# Patient Record
Sex: Female | Born: 1950 | Race: White | Hispanic: No | Marital: Married | Smoking: Never smoker
Health system: Southern US, Community
[De-identification: ages and names within clinical notes are randomized; demographics above are authoritative.]

## PROBLEM LIST (undated history)

## (undated) DIAGNOSIS — M1990 Unspecified osteoarthritis, unspecified site: Secondary | ICD-10-CM

## (undated) DIAGNOSIS — I499 Cardiac arrhythmia, unspecified: Secondary | ICD-10-CM

## (undated) DIAGNOSIS — I10 Essential (primary) hypertension: Secondary | ICD-10-CM

## (undated) DIAGNOSIS — T7840XA Allergy, unspecified, initial encounter: Secondary | ICD-10-CM

## (undated) DIAGNOSIS — M5030 Other cervical disc degeneration, unspecified cervical region: Secondary | ICD-10-CM

## (undated) HISTORY — PX: ABDOMINAL HYSTERECTOMY: SHX81

## (undated) HISTORY — DX: Cardiac arrhythmia, unspecified: I49.9

## (undated) HISTORY — DX: Essential (primary) hypertension: I10

## (undated) HISTORY — PX: COLONOSCOPY: SHX174

## (undated) HISTORY — DX: Allergy, unspecified, initial encounter: T78.40XA

---

## 1996-12-15 HISTORY — PX: BLADDER SUSPENSION: SHX72

## 2000-02-11 ENCOUNTER — Encounter: Admission: RE | Admit: 2000-02-11 | Discharge: 2000-02-11 | Payer: Self-pay | Admitting: Internal Medicine

## 2000-02-11 ENCOUNTER — Encounter: Payer: Self-pay | Admitting: Internal Medicine

## 2017-11-09 ENCOUNTER — Other Ambulatory Visit: Payer: Self-pay | Admitting: Gastroenterology

## 2017-12-15 DIAGNOSIS — I4891 Unspecified atrial fibrillation: Secondary | ICD-10-CM | POA: Insufficient documentation

## 2017-12-28 ENCOUNTER — Encounter: Payer: Self-pay | Admitting: Gastroenterology

## 2018-01-15 ENCOUNTER — Encounter: Payer: Self-pay | Admitting: Gastroenterology

## 2018-01-30 ENCOUNTER — Encounter: Payer: Self-pay | Admitting: Gastroenterology

## 2018-02-16 ENCOUNTER — Encounter: Payer: Self-pay | Admitting: Cardiology

## 2018-02-16 ENCOUNTER — Ambulatory Visit: Payer: PRIVATE HEALTH INSURANCE | Attending: Cardiology | Admitting: Cardiology

## 2018-02-16 ENCOUNTER — Other Ambulatory Visit: Payer: Self-pay | Admitting: Cardiology

## 2018-02-16 VITALS — BP 157/70 | HR 63 | Ht 64.0 in | Wt 149.0 lb

## 2018-02-16 DIAGNOSIS — I4891 Unspecified atrial fibrillation: Secondary | ICD-10-CM

## 2018-02-16 NOTE — Progress Notes (Addendum)
I have had the pleasure of seeing your patient, Sheena Rodriguez in the Cardiology Clinic at the outpatient office at [facility].  Below is a summary of our recent visit:       Patient is  67 yo female with:    Past Medical History of:    1. A. Fibrillation  2. Vit D deficiency  3. Allergic rhinitis  4. ? Aortic insufficiency/?ASD  5. H/o vasovagal syncope  6. Heart murmur.    She presents for cardiology visit (for a second opinion). She had some intermittent palpitations in November. Had ECG which showed sinus bradycardia, first degree AV block (new), and nonspecific ST-T changes. Holter was done, was negative. Was noted to have A. Fibrillation pre-procedure prior to colonoscopy. She was seen by cardiologist few days later and was prescribed Xarelto and Metoprolol 25 mg bid. She felt not good with Metoprolol ("spacy", "buzzing sensation in her head", had near syncopal episode). She stopped Metoprolol Feb 1st, 2019. Has no palpitations recently. Was not aware she had A. Fib when diagnosed. Had event monitor in February 2019. She has occasional chest tightness, it is on the left side, 2-3/10 in intensity. Almost feels like a "pulled pectoral muscle". It is very brief. It was happening since she stopped metoprolol. She does exercise. However she feels tired. No SOB, orthopnea, PND. No leg edema. In the past her BP was 124/70 and 118/68 mmHg at previous visits.  Her husband has h/o prostate cancer, and now esophageal cancer 8 years ago.    ROS: Card: as per HPI. Other systems were reviewed and were negative.      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 No children.      FH: Mother was an alcoholic and smoker, died from lung cancer at 22 yo.   Father died at 36 yo from pancreatic cancer.  Brother (57 yo)- alcoholic.   Grandmother had CVA.    Current Outpatient Prescriptions   Medication Sig    rivaroxaban (XARELTO) 20 MG tablet Take 20 mg by mouth daily     cholecalciferol (VITAMIN D) 2000 units tablet Take 2,000 Units by mouth daily    multi-vitamin (MULTIVITAMIN) per tablet Take 1 tablet by mouth daily    Magnesium 200 MG TABS Take by mouth     No current facility-administered medications for this visit.        BP 157/70 (BP Location: Right arm, Patient Position: Sitting, Cuff Size: adult)    Pulse 63    Ht 1.626 m (5\' 4" )    Wt 67.6 kg (149 lb)    BMI 25.58 kg/m    Appears comfortable, A&O x3, NAD.  HEENT: Pupils equal round reactive. Sclerae anicteric. Neck is supple with no lymphadenopathy or thyromegaly. JVP is not elevated.   Cardiovascular: Normal S1-S2 no S3 no S4. Ejection click, 1/6 SEM. PMI is non-sustained and non-displaced.   Respiratory: Normal vesicular breath sounds. Clear to auscultation bilaterally without rhonchi, rales or wheezes.   GI: Bowel sounds are present. There is no hepatosplenomegaly, bruits or masses.   Extremities: 2+ dorsalis pedis pulse. 2+ posterior tibial pulse. No clubbing, cyanosis or edema.     ECG: NSR 60 bpm, normal.    Labs: Hg 13.3, HCT 41%    TSH 1.83, Glucose 87, BUN 14, creat 0.7, K 4.4, LFT's normal, Chol 175, TG 48, HDL 86, LDL 79.      A/P: Patient is a 67 yo female with  PAF.  Get results of previos testing.  Stress Echo to evaluate LVEF, r/o bicuspid AV (based on exam) and arrhythmia with exercise, r/o ischemia.  BP elevated. Needs probably to be treated. She will buy BP monitor and journal BP at home.  Low salt diet.  Magnesium supplement (Slow Mag).  Lipid profile acceptable.  Risk factor modification was discussed.       Thank-you very much for allowing me to participate in the care of Sheena Rodriguez. Please feel free to contact our office if you have any further questions/concerns.    Sincerely,    Unknown FoleyHanna Porcia Morganti MD

## 2018-02-23 LAB — EKG 12-LEAD
P: 48 deg
PR: 204 ms
QRS: 20 deg
QRSD: 82 ms
QT: 424 ms
QTc: 424 ms
Rate: 60 {beats}/min
Severity: NORMAL
T: 17 deg

## 2018-03-02 ENCOUNTER — Ambulatory Visit
Admission: RE | Admit: 2018-03-02 | Discharge: 2018-03-02 | Disposition: A | Discharge status: Home / Self Care | Payer: PRIVATE HEALTH INSURANCE | Source: Ambulatory Visit | Attending: Cardiology | Admitting: Cardiology

## 2018-03-02 ENCOUNTER — Ambulatory Visit: Payer: PRIVATE HEALTH INSURANCE | Attending: Cardiology | Admitting: Cardiology

## 2018-03-02 ENCOUNTER — Encounter: Payer: Self-pay | Admitting: Cardiology

## 2018-03-02 VITALS — BP 149/67 | HR 76 | Ht 64.0 in | Wt 149.0 lb

## 2018-03-02 DIAGNOSIS — I4891 Unspecified atrial fibrillation: Secondary | ICD-10-CM

## 2018-03-02 LAB — EXERCISE STRESS ECHO COMPLETE
AR CWD Gradient (peak): 62.2 mmHg
AR Velocity (peak): 394.4 cm/s
AV Area (LVOT SR Mtd): 2.99 cm2
AV CWD Velocity (Peak): 105.4 cm/s
AV Gradient (peak): 0.4 mmHg
Aortic Diameter (mid tubular): 2.9 cm
Aortic Diameter (sinus of Valsalva): 3.3 cm
BMI: 25.6 kg/m2
BP Diastolic: 68 mmHg
BP Systolic: 118 mmHg
BSA: 1.75 m2
Deceleration Time - AR: 2718.6 ms
Deceleration Time - MV: 195 ms
E/A ratio: 0.94
ECG PR interval: 220 ms
ECG QRS interval: 80 ms
Echo RV Stroke Work Index Estimate: 823.2 mmHg•mL/m2
Estimated workload: 11 METS
Heart Rate: 70 {beats}/min
Height: 64 in
IVC Diameter: 1.7 cm
LA Diameter BSA Index: 2.3 cm/m2
LA Diameter Height Index: 2.5 cm/m
LA Diameter: 4.1 cm
LA Systolic Vol BSA Index: 37.7 mL/m2
LA Systolic Vol Height Index: 40.6 mL/m
LA Systolic Volume: 66 mL
LV ASE Mass BSA Index: 67.8 gm/m2
LV ASE Mass Height 2.7 Index: 31.9 gm/m2.7
LV ASE Mass Height Index: 72.9 gm/m
LV ASE Mass: 118.6 gm
LV CO BSA Index: 2.35 L/min/m2
LV Cardiac Output: 4.12 L/min
LV Diastolic Volume Index: 51.5 mL/m2
LV Posterior Wall Thickness: 0.8 cm
LV SV - LVOT SV Diff: -4 mL
LV SV BSA Index: 33.6 mL/m2
LV SV Height Index: 36.2 mL/m
LV Septal Thickness: 0.9 cm
LV Stroke Volume: 58.8 mL
LV Systolic Volume Index: 17.9 mL/m2
LVED Diameter BSA Index: 2.5 cm/m2
LVED Diameter Height Index: 2.7 cm/m
LVED Diameter: 4.4 cm
LVED Volume BSA Index: 51 ml/m2
LVED Volume BSA Index: 51.5 mL/m2
LVED Volume Height Index: 55.4 mL/m
LVED Volume: 90.1 mL
LVEF (Volume): 65 %
LVES Diameter BSA Index: 1.6 cm/m2
LVES Diameter Height Index: 1.7 cm/m
LVES Diameter: 2.8 cm
LVES Volume BSA Index: 17.9 mL/m2
LVES Volume BSA Index: 18 ml/m2
LVES Volume Height Index: 19.3 mL/m
LVES Volume: 31.3 mL
LVOT + AV Gradient (peak): 4.4 mmHg
LVOT Area (calculated): 3.14 cm2
LVOT Cardiac Index: 2.51 L/min/m2
LVOT Cardiac Output: 4.4 L/min
LVOT Diameter: 2 cm
LVOT PWD VTI: 20 cm
LVOT PWD Velocity (mean): 60.1 cm/s
LVOT PWD Velocity (peak): 100.3 cm/s
LVOT SV BSA Index: 35.89 mL/m2
LVOT SV Height Index: 38.6 mL/m
LVOT Stroke Rate (mean): 188.7 mL/s
LVOT Stroke Rate (peak): 314.9 mL/s
LVOT Stroke Volume: 62.8 cc
LVOT/AV Velocity Ratio: 0.95
MPHR: 155 {beats}/min
MR Regurgitant Fraction (LV SV Mtd): -0.07
MR Regurgitant Volume (LV SV Mtd): -4 mL
MV Peak A Velocity: 69.2 cm/s
MV Peak E Velocity: 65 cm/s
Mitral Annular E/Ea Vel Ratio: 11.82
Mitral Annular Ea Velocity: 5.5 cm/s
PA Mean Pressure Estimate: 19.2 mmHg
PA Systolic Pressure Estimate: 27.7 mmHg
PR CWD Gradient (peak): 11.2 mmHg
PV CWD Velocity (peak): 110 cm/s
Peak DBP: 90 mmHg
Peak Gradient - TR: 24.5 mmHg
Peak HR: 147 {beats}/min
Peak SBP: 178 mmHg
Peak Velocity - TR: 247.4 cm/s
Peak Velocity PR: 167.1 cm/s
Percent MPHR: 94.8 %
Pressure Half-Time - AR: 788.4 ms
Pulmonary Vascular Resistance Estimate: 6 mmHg
RA Pressure Estimate: 8 mmHg
RA Volume BSA Index: 15.4 mL/m2
RA Volume Height Index: 16.6 mL/m
RA Volume: 27 mL
RPP: 26166 BPM x mmHG
RR Interval: 857.14 ms
RV Peak Systolic Pressure: 32.5 mmHg
RVOT + PV Gradient (peak): 4.8 mmHg
Stress Peak Stage: 3.67
Stress duration (min): 11 min
Stress duration (sec): 1 s
Weight (lbs): 149 [lb_av]
Weight: 2384 oz

## 2018-03-02 NOTE — Progress Notes (Signed)
I have had the pleasure of seeing your patient, Sheena Rodriguez in the Cardiology Clinic at the outpatient office at [facility].  Below is a summary of our recent visit:       Patient is  67 yo female with:    Past Medical History of:    1. A. Fibrillation  2. Vit D deficiency  3. Allergic rhinitis  4. ? Aortic insufficiency/?ASD  5. H/o vasovagal syncope  6. Heart murmur/sclerotic AV/mild AI.      She presents for cardiology visit (for a second opinion). She had some intermittent palpitations in November. Had ECG which showed sinus bradycardia, first degree AV block (new), and nonspecific ST-T changes. Holter was done, was negative. Was noted to have A. Fibrillation pre-procedure prior to colonoscopy. She was seen by cardiologist few days later and was prescribed Xarelto and Metoprolol 25 mg bid. She felt not good with Metoprolol ("spacy", "buzzing sensation in her head", had near syncopal episode). She stopped Metoprolol Feb 1st, 2019. Has no palpitations recently. Was not aware she had A. Fib when diagnosed. Had event monitor in February 2019 (no results yet). She has occasional chest tightness. Almost feels like a "pulled pectoral muscle". It is very brief. She does exercise- no symptoms with exercise. Stress test was normal. However she feels tired. No SOB, orthopnea, PND.  No leg edema.  She is planning to move to [place].    ROS: Card: as per HPI. Other systems were reviewed and were negative.      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 Her husband has h/o prostate cancer, and now esophageal cancer 8 years ago.  No children.      FH: Mother was an alcoholic and smoker, died from lung cancer at 29 yo.   Father died at 43 yo from pancreatic cancer.  Brother (60 yo)- alcoholic.   Grandmother had CVA.    Current Outpatient Prescriptions   Medication Sig    other supply By no specified route   tumeric    rivaroxaban (XARELTO) 20 MG tablet Take 20 mg  by mouth daily    cholecalciferol (VITAMIN D) 2000 units tablet Take 2,000 Units by mouth daily    multi-vitamin (MULTIVITAMIN) per tablet Take 1 tablet by mouth daily    Magnesium 200 MG TABS Take by mouth     No current facility-administered medications for this visit.        BP 149/67 (BP Location: Left arm, Patient Position: Sitting, Cuff Size: adult)    Pulse 76    Ht 1.626 m (5\' 4" )    Wt 67.6 kg (149 lb)    BMI 25.58 kg/m    Appears comfortable, A&O x3, NAD.  HEENT: Pupils equal round reactive. Sclerae anicteric. Neck is supple with no lymphadenopathy or thyromegaly. JVP is not elevated.   Cardiovascular: Normal S1-S2 no S3 no S4. Ejection click, 1/6 SEM. PMI is non-sustained and non-displaced.   Respiratory: Normal vesicular breath sounds. Clear to auscultation bilaterally without rhonchi, rales or wheezes.   GI: Bowel sounds are present. There is no hepatosplenomegaly, bruits or masses.   Extremities: 2+ dorsalis pedis pulse. 2+ posterior tibial pulse. No clubbing, cyanosis or edema.     Stress Echo: Normal LVEF without significant regional wall motion abnormalities. Mild left atrial enlargement. Mild aortic valve sclerosis with mild insufficiency.  Mild mitral regurgitation.  Estimated normal pulmonary artery systolic pressure.  Normal hemodynamic response to exercise.  No evidence  of exercise induced ischemia at a moderate cardiac workload.    Labs: Hg 13.3, HCT 41%    TSH 1.83, Glucose 87, BUN 14, creat 0.7, K 4.4, LFT's normal, Chol 175, TG 48, HDL 86, LDL 79.      A/P: Patient is a 67 yo female with PAF. No recurrent A. Fib.  Will obtain Holter in May 2019 to see if there is any evidence of A. Fib.  Stress Echo with normal LVEF, mildly sclerotic AV (however unable to r/o bicuspid AV (based on exam), but only minimal AI.  No evidence of ischemia.  BP elevated again today (however, at home normal 125/86 mmHg, on few occasions 138/87 mmHg). Needs probably to be treated. She will continue journal BP at  home for now. Bring the BP monitor to the next visit. She tried to improve her diet. Low salt diet.  Continue Magnesium supplement (Slow Mag).  Lipid profile acceptable.  Risk factor modification was discussed.       Thank-you very much for allowing me to participate in the care of Sheena Rodriguez. Please feel free to contact our office if you have any further questions/concerns.    Sincerely,    Unknown FoleyHanna Cortlandt Capuano MD

## 2018-03-02 NOTE — Addendum Note (Signed)
Addended by: Mariel CraftMIESZCZANSKA, Kohner Orlick Z on: 03/02/2018 10:47 AM     Modules accepted: Orders

## 2018-03-02 NOTE — Discharge Instructions (Signed)
Patient is here for a stress echo. Instructed patient to resume medications and to follow up with referring provider. Patient verbalized understanding.

## 2018-04-05 ENCOUNTER — Encounter: Payer: Self-pay | Admitting: Gastroenterology

## 2018-04-06 ENCOUNTER — Encounter: Payer: Self-pay | Admitting: Gastroenterology

## 2018-04-23 ENCOUNTER — Ambulatory Visit: Payer: PRIVATE HEALTH INSURANCE | Attending: Cardiology | Admitting: Cardiology

## 2018-04-23 ENCOUNTER — Encounter: Payer: Self-pay | Admitting: Cardiology

## 2018-04-23 VITALS — BP 156/76 | HR 55 | Ht 64.0 in | Wt 150.0 lb

## 2018-04-23 DIAGNOSIS — I4891 Unspecified atrial fibrillation: Secondary | ICD-10-CM

## 2018-04-23 NOTE — Progress Notes (Addendum)
I have had the pleasure of seeing your patient, Sheena Rodriguez in the Cardiology Clinic at the outpatient office at [facility]. Below is a summary of our recent visit:       Patient is  67 yo female with:    Past Medical History of:    1. A. Fibrillation  2. Vit D deficiency  3. Allergic rhinitis  4. ? Aortic insufficiency/?ASD  5. H/o vasovagal syncope  6. Heart murmur/sclerotic AV/mild AI.      She presents for cardiology visit (for a second opinion). She had some intermittent palpitations in November. Had ECG which showed sinus bradycardia, first degree AV block (new), and nonspecific ST-T changes. Holter was done, was negative. Was noted to have A. Fibrillation pre-procedure prior to colonoscopy. She was seen by cardiologist few days later and was prescribed Xarelto and Metoprolol 25 mg bid. She felt not good with Metoprolol ("spacy", "buzzing sensation in her head", had near syncopal episode). She stopped Metoprolol Feb 1st, 2019. Has no palpitations recently. Was not aware she had A. Fib when diagnosed. Had event monitor in February 2019 (no results yet). Chest discomfort resolved. She does exercise- no symptoms with exercise. Stress test was normal. However she feels tired. No SOB, orthopnea, PND.  No leg edema. No palpitations, or dizziness. She reports feeling very well recently. She reduced caffeine intake. BP at home 122/87-137/83 mmHg.  She is planning to move to [place].     ROS: Card: as per HPI. Other systems were reviewed and were negative.      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 Her husband has h/o prostate cancer, and now esophageal cancer 8 years ago.  No children.      FH: Mother was an alcoholic and smoker, died from lung cancer at 47 yo.   Father died at 42 yo from pancreatic cancer.  Brother (30 yo)- alcoholic.   Grandmother had CVA.    Current Outpatient Prescriptions   Medication Sig    other supply By no specified route    tumeric    rivaroxaban (XARELTO) 20 MG tablet Take 20 mg by mouth daily    cholecalciferol (VITAMIN D) 2000 units tablet Take 2,000 Units by mouth daily    multi-vitamin (MULTIVITAMIN) per tablet Take 1 tablet by mouth daily    Magnesium 200 MG TABS Take by mouth     No current facility-administered medications for this visit.        BP 156/76 (BP Location: Right arm, Patient Position: Sitting, Cuff Size: adult)    Pulse 55    Ht 1.626 m ( )    Wt 68 kg (150 lb)    BMI 25.75 kg/m    Repeat BP 135/88 mmHg.  Appears comfortable, A&O x3, NAD.  HEENT: Pupils equal round reactive. Sclerae anicteric. Neck is supple with no lymphadenopathy or thyromegaly. JVP is not elevated.   Cardiovascular: Normal S1-S2 no S3 no S4. Ejection click, 1/6 SEM. PMI is non-sustained and non-displaced.   Respiratory: Normal vesicular breath sounds. Clear to auscultation bilaterally without rhonchi, rales or wheezes.   GI: Bowel sounds are present. There is no hepatosplenomegaly, bruits or masses.   Extremities: 2+ dorsalis pedis pulse. 2+ posterior tibial pulse. No clubbing, cyanosis or edema.     Stress Echo: Normal LVEF without significant regional wall motion abnormalities. Mild left atrial enlargement. Mild aortic valve sclerosis with mild insufficiency.  Mild mitral regurgitation.  Estimated normal pulmonary artery  systolic pressure.  Normal hemodynamic response to exercise.  No evidence of exercise induced ischemia at a moderate cardiac workload.    Labs (April 2019): HCT 42.5%    TSH 1.95, Glucose 94, BUN 14, creat 0.8, K 4.4, LFT's normal, Mg 2.2.    Chol 175, TG 48, HDL 86, LDL 79.      A/P: Patient is a 67 yo female with PAF. No recurrent A. Fib. She feels great.  Will obtain Holter in May 2019 to see if there is any evidence of A. Fib.  Stress Echo with normal LVEF, mildly sclerotic AV (however unable to r/o bicuspid AV (based on exam), but only minimal AI.  No evidence of ischemia.  BP elevated again today (however, at  home normal 125/86 mmHg, on few occasions 138/87 mmHg). Needs probably to be treated. She wants to hold off.  Low sodium diet. She tried to improve her diet. Low salt diet. Continue to monitor at home. In case she needs colonoscopy, can hold Xarelto as needed.  Continue Magnesium supplement. Reduced caffeine intake.  Lipid profile acceptable.  Risk factor modification was discussed.   Will send records to [facility].    Thank-you very much for allowing me to participate in the care of Sheena Rodriguez. Please feel free to contact our office if you have any further questions/concerns.    Sincerely,    Unknown Foley MD

## 2018-04-23 NOTE — Patient Instructions (Signed)
Plant based/Whole foods program:    Diet Tracking Tools:  Myfitnesspal.com (comes as a smartphone app and can be found online from any computer)    Activity Tracking Tools:  Fitbit  Any pedometer (counts number of steps taken per day)  Runtastic App for smartphones    MOVIES/BOOKS:  Forks Over Knives (Available on Netflix. It was inspired by The China Study)  Reciepe book: Flavor    GUIDEBOOKS:  The Campbell Plan, by Thomas M. Campbell, MD   Eat to Live, by Joel Furhman, MD  Dr. Neal Barnard's Program for Reversing Diabetes, by Neal Barnard, MD  Prevent and Reverse Heart Disease, by Caldwell Esselstyn, Jr., MD  The Starch Solution, by John McDougall, MD  Engine 2 Diet, by Rip Essesltyn    BOOKS ABOUT THE SCIENCE AND IMPACT OF DIET  The [place] Study, by T. Colin Campbell, PhD and Thomas M. Campbell, MD   The Food Revolution, by John Robbins    COOKBOOKS/RECIPES  Everyday Happy Herbivore, by Lindsay Nixon  The China Study Cookbook, by LeAnne Campbell   Www.fatfreevegan.com (stick to getting recipes from the blogs, particularly Susan Voisin, not reader submissions)  Www.straightupfood.com  Www.happyherbivore.com    ONLINE LECTURE (FREE):  Www.dresselstyn.com  Watch 2hr video in 'video' tab, about 7-8 videos down ([facility], 2 hour presentation)

## 2018-11-15 ENCOUNTER — Other Ambulatory Visit: Payer: Self-pay

## 2018-11-15 DIAGNOSIS — Z1211 Encounter for screening for malignant neoplasm of colon: Secondary | ICD-10-CM

## 2018-12-13 ENCOUNTER — Telehealth: Payer: Self-pay | Admitting: Gastroenterology

## 2018-12-13 NOTE — Telephone Encounter (Signed)
Patient called and has a colonoscopy scheduled for 12-20-17 with Dr Marius Ditch. She would like to know when to stop taking her Xarelto.

## 2018-12-14 NOTE — Telephone Encounter (Signed)
Returned patients call regarding when to stop her Xarelto.  She stated that in the past she stopped a week before and she has already stopped it just to make sure she can get her colonoscopy without having to change her date.  I've faxed blood thinner request to Dr. Betha Loa office again, also contacted office directly to inquire on status.  Kathy Holt received my fax and said she will ask Dr. Sebastian Blas to advise and send back.  Thanks [employer]

## 2018-12-17 ENCOUNTER — Encounter: Payer: Self-pay | Admitting: *Deleted

## 2018-12-20 ENCOUNTER — Ambulatory Visit
Admission: RE | Admit: 2018-12-20 | Payer: BC Managed Care – PPO | Source: Home / Self Care | Admitting: Gastroenterology

## 2018-12-20 ENCOUNTER — Encounter: Admission: RE | Payer: Self-pay | Source: Home / Self Care

## 2018-12-20 DIAGNOSIS — Z1211 Encounter for screening for malignant neoplasm of colon: Secondary | ICD-10-CM

## 2018-12-20 SURGERY — COLONOSCOPY WITH PROPOFOL
Anesthesia: General

## 2019-09-02 ENCOUNTER — Other Ambulatory Visit: Payer: Self-pay | Admitting: Family Medicine

## 2019-09-02 DIAGNOSIS — Z1231 Encounter for screening mammogram for malignant neoplasm of breast: Secondary | ICD-10-CM

## 2019-10-06 ENCOUNTER — Other Ambulatory Visit: Payer: Self-pay | Admitting: Internal Medicine

## 2019-10-06 DIAGNOSIS — Z1231 Encounter for screening mammogram for malignant neoplasm of breast: Secondary | ICD-10-CM

## 2019-11-15 ENCOUNTER — Ambulatory Visit
Admission: RE | Admit: 2019-11-15 | Discharge: 2019-11-15 | Disposition: A | Discharge status: Home / Self Care | Payer: BC Managed Care – PPO | Source: Ambulatory Visit | Attending: Internal Medicine | Admitting: Internal Medicine

## 2019-11-15 DIAGNOSIS — Z1231 Encounter for screening mammogram for malignant neoplasm of breast: Secondary | ICD-10-CM | POA: Insufficient documentation

## 2019-11-15 IMAGING — MG DIGITAL SCREENING BILAT W/ TOMO W/ CAD
8 series · 8 of 24 positions shown · non-contrast
Comparison: Previous exam(s).

CLINICAL DATA: Screening.

EXAM:
DIGITAL SCREENING BILATERAL MAMMOGRAM WITH TOMO AND CAD

[L CC synth-2D]
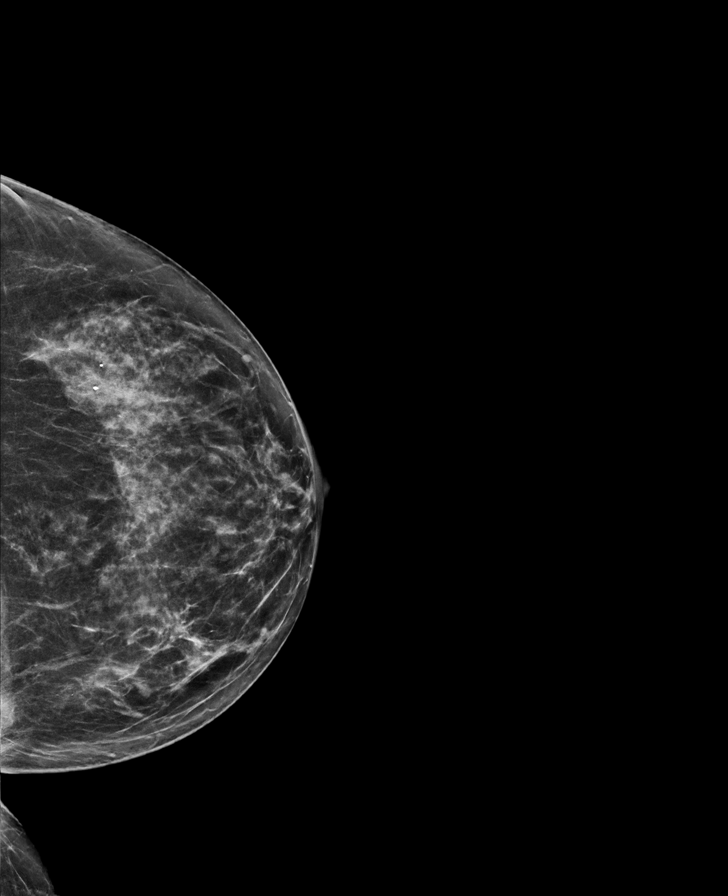

[R CC synth-2D]
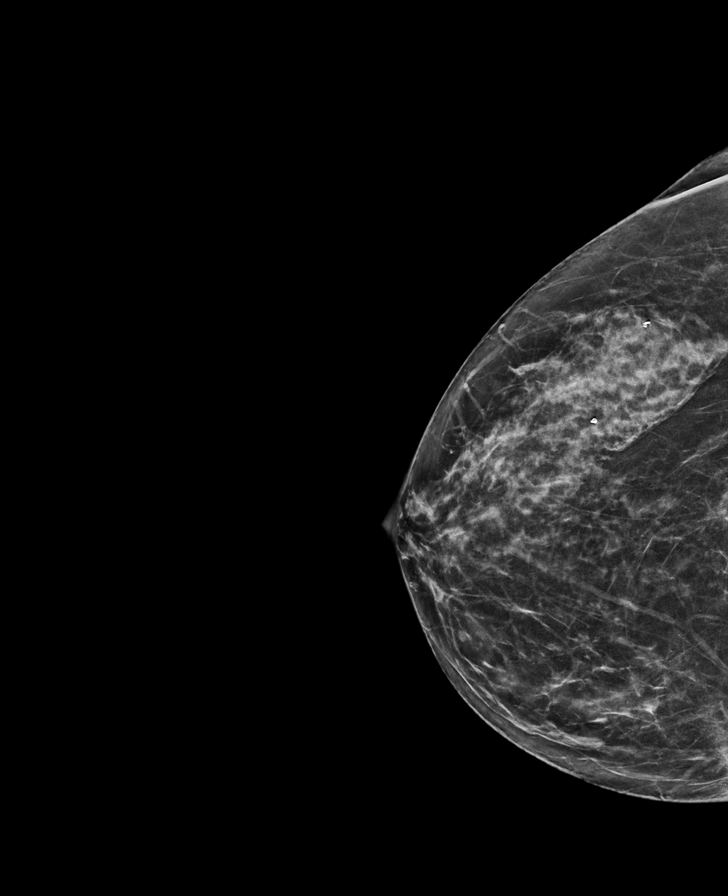

[R MLO synth-2D]
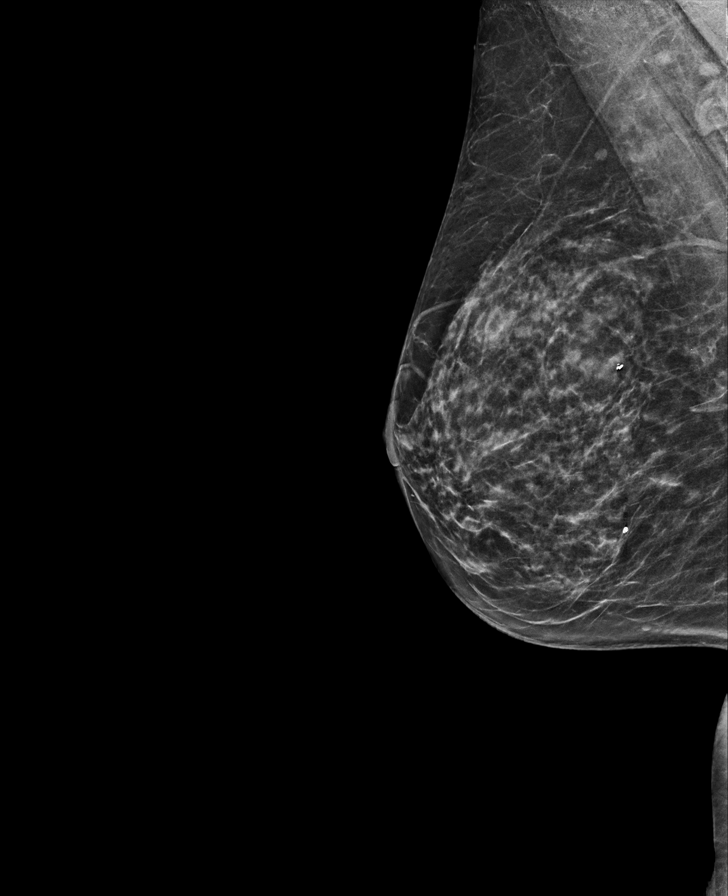

[L MLO synth-2D]
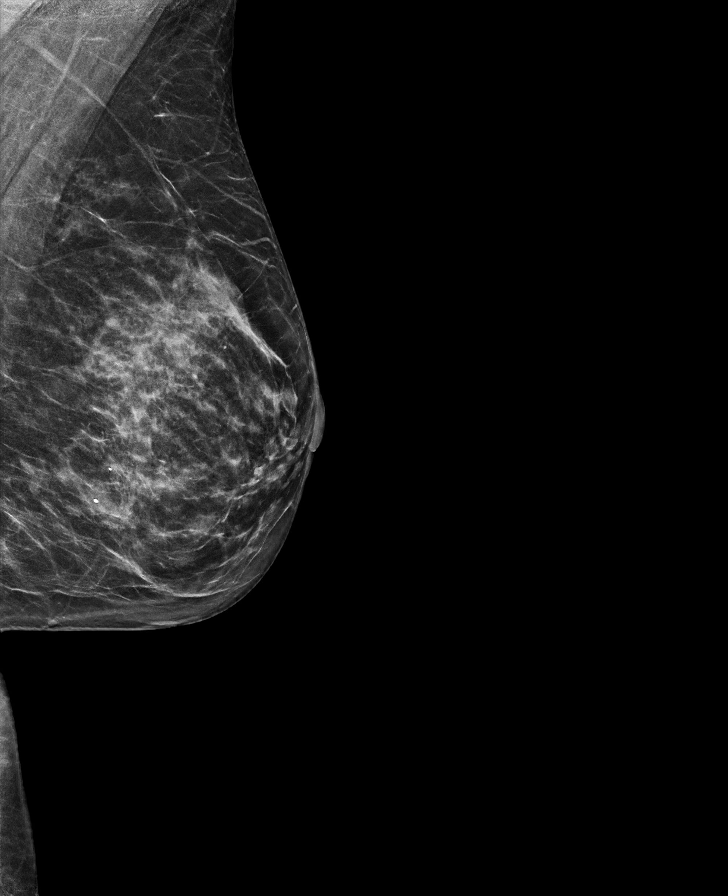

[L CC tomo · tomo slice 30/59.0]
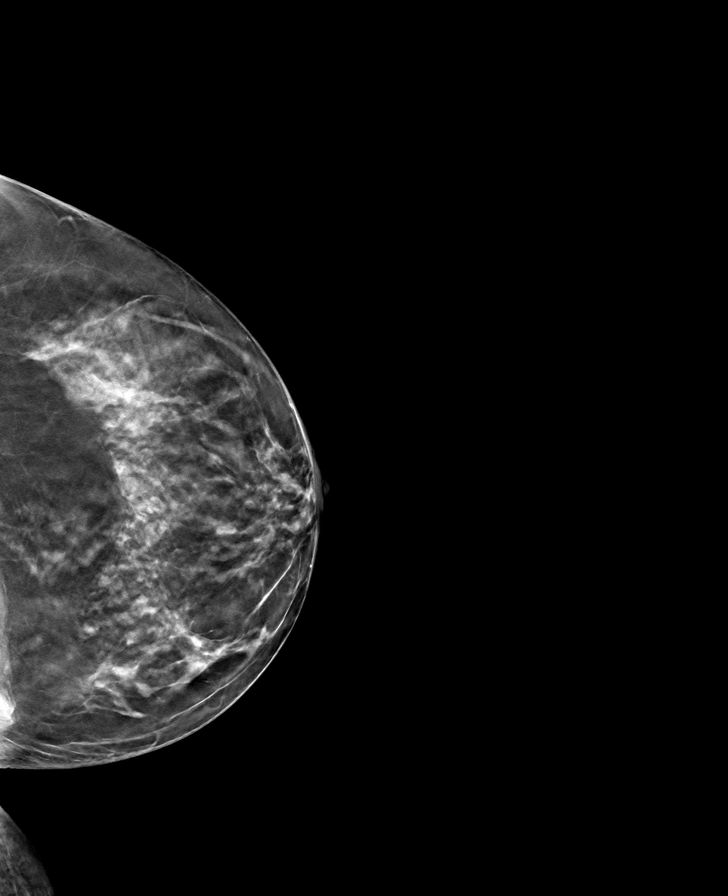

[R CC tomo · tomo slice 29/57.0]
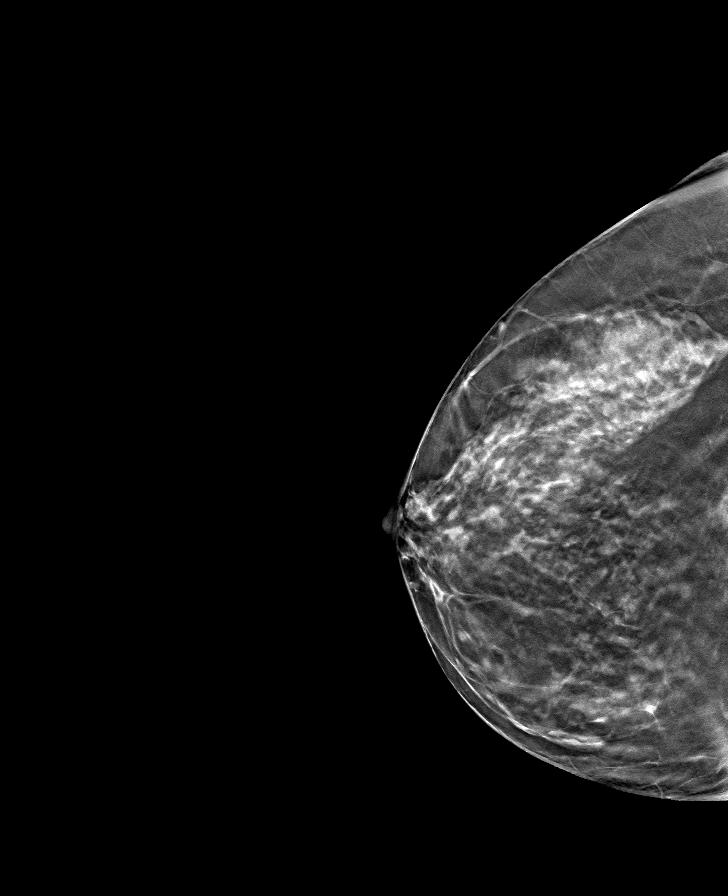

[L MLO tomo · tomo slice 30/59.0]
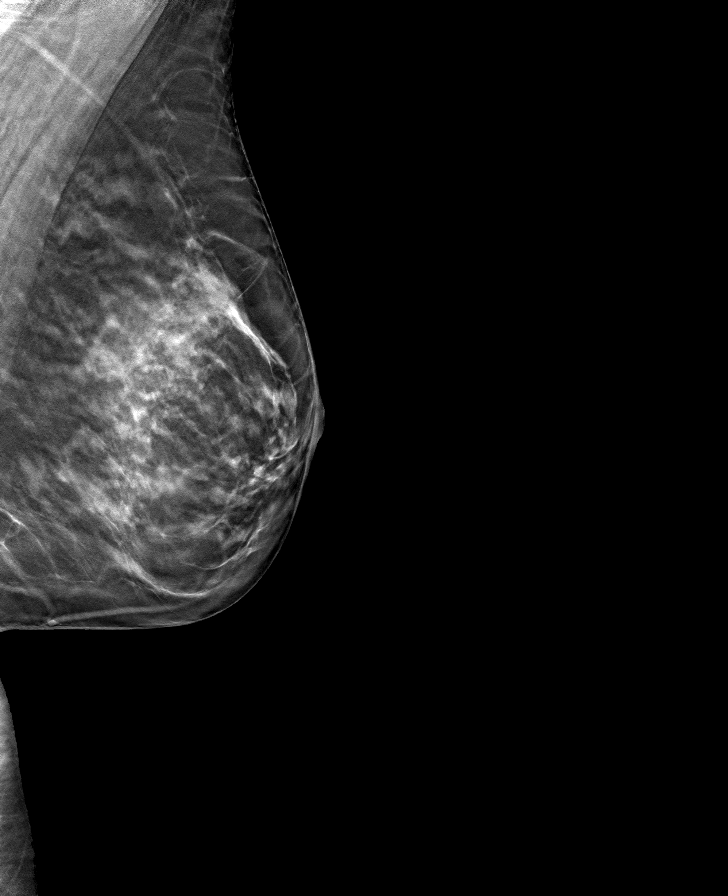

[R MLO tomo · tomo slice 29/58.0]
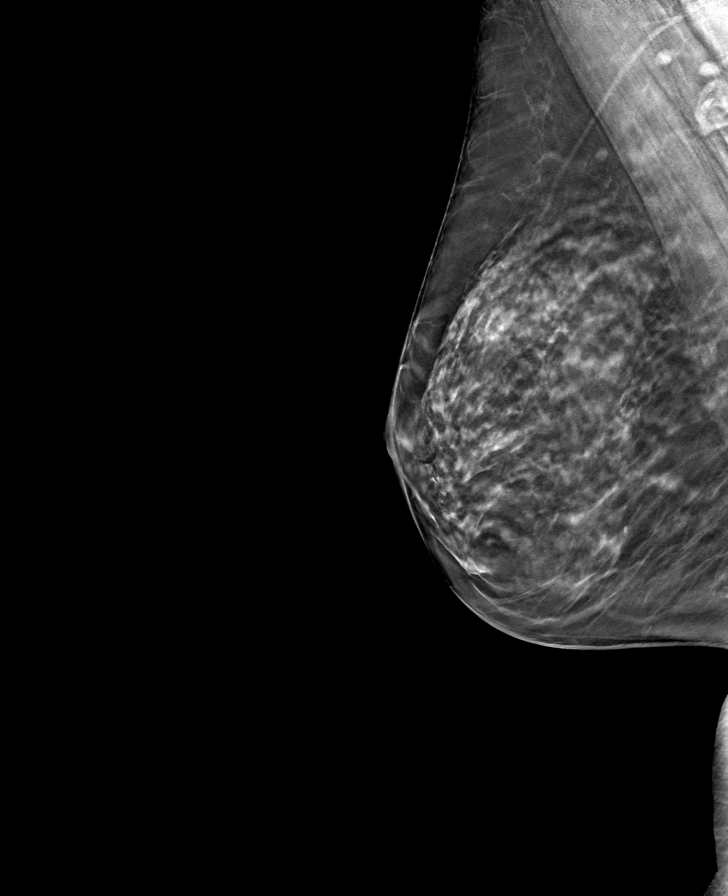

[8 of 24 positions shown; findings below may reference images not displayed]

ACR Breast Density Category c: The breast tissue is heterogeneously
dense, which may obscure small masses.
FINDINGS: There are no findings suspicious for malignancy. Images were
processed with CAD.
IMPRESSION: No mammographic evidence of malignancy. A result letter of this
screening mammogram will be mailed directly to the patient.

RECOMMENDATION:
Screening mammogram in one year. (Code:[5V])

BI-RADS CATEGORY  1: Negative.

## 2020-01-24 ENCOUNTER — Other Ambulatory Visit: Payer: Self-pay | Admitting: Sports Medicine

## 2020-01-24 DIAGNOSIS — M47816 Spondylosis without myelopathy or radiculopathy, lumbar region: Secondary | ICD-10-CM

## 2020-01-24 DIAGNOSIS — M5137 Other intervertebral disc degeneration, lumbosacral region: Secondary | ICD-10-CM

## 2020-01-24 DIAGNOSIS — G8929 Other chronic pain: Secondary | ICD-10-CM

## 2020-01-24 DIAGNOSIS — M545 Low back pain, unspecified: Secondary | ICD-10-CM

## 2020-01-27 ENCOUNTER — Ambulatory Visit: Payer: BC Managed Care – PPO | Attending: Internal Medicine

## 2020-01-27 DIAGNOSIS — Z23 Encounter for immunization: Secondary | ICD-10-CM

## 2020-01-27 NOTE — Progress Notes (Signed)
   Covid-19 Vaccination Clinic  Name:  Kathy Holt    MRN: [id number] DOB: 02-16-1951  01/27/2020  [name] was observed post Covid-19 immunization for 15 minutes without incidence. She was provided with Vaccine Information Sheet and instruction to access the V-Safe system.   [name] was instructed to call 911 with any severe reactions post vaccine: Marland Kitchen Difficulty breathing  . Swelling of your face and throat  . A fast heartbeat  . A bad rash all over your body  . Dizziness and weakness    Immunizations Administered    Name Date Dose VIS Date Route   Pfizer COVID-19 Vaccine 01/27/2020  9:04 AM 0.3 mL 11/25/2019 Intramuscular   Manufacturer: [place]   Lot: [id number]   [place]: [id number]

## 2020-02-08 ENCOUNTER — Ambulatory Visit: Payer: BC Managed Care – PPO

## 2020-02-21 ENCOUNTER — Ambulatory Visit: Payer: BC Managed Care – PPO | Attending: Internal Medicine

## 2020-02-21 DIAGNOSIS — Z23 Encounter for immunization: Secondary | ICD-10-CM

## 2020-02-21 NOTE — Progress Notes (Signed)
   Covid-19 Vaccination Clinic  Name:  AVANA JANIAK    MRN: [id number] DOB: [birth year]  02/21/2020  Ms. Sok was observed post Covid-19 immunization for 15 minutes without incident. She was provided with Vaccine Information Sheet and instruction to access the V-Safe system.   Ms. Molesky was instructed to call 911 with any severe reactions post vaccine: Marland Kitchen Difficulty breathing  . Swelling of face and throat  . A fast heartbeat  . A bad rash all over body  . Dizziness and weakness   Immunizations Administered    Name Date Dose VIS Date Route   Pfizer COVID-19 Vaccine 02/21/2020  9:06 AM 0.3 mL 11/25/2019 Intramuscular   Manufacturer: [place]   Lot: [id number]   [place]: [id number]

## 2020-03-15 ENCOUNTER — Other Ambulatory Visit: Payer: Self-pay | Admitting: Sports Medicine

## 2020-03-15 DIAGNOSIS — M545 Low back pain, unspecified: Secondary | ICD-10-CM

## 2020-03-15 DIAGNOSIS — M5137 Other intervertebral disc degeneration, lumbosacral region: Secondary | ICD-10-CM

## 2020-03-15 DIAGNOSIS — G8929 Other chronic pain: Secondary | ICD-10-CM

## 2020-03-15 DIAGNOSIS — M47816 Spondylosis without myelopathy or radiculopathy, lumbar region: Secondary | ICD-10-CM

## 2020-03-27 ENCOUNTER — Other Ambulatory Visit: Payer: Self-pay | Admitting: Sports Medicine

## 2020-03-27 DIAGNOSIS — M545 Low back pain, unspecified: Secondary | ICD-10-CM

## 2020-03-27 DIAGNOSIS — M5137 Other intervertebral disc degeneration, lumbosacral region: Secondary | ICD-10-CM

## 2020-03-27 DIAGNOSIS — G8929 Other chronic pain: Secondary | ICD-10-CM

## 2020-03-27 DIAGNOSIS — M51379 Other intervertebral disc degeneration, lumbosacral region without mention of lumbar back pain or lower extremity pain: Secondary | ICD-10-CM

## 2020-03-27 DIAGNOSIS — M47816 Spondylosis without myelopathy or radiculopathy, lumbar region: Secondary | ICD-10-CM

## 2020-04-05 DIAGNOSIS — G4700 Insomnia, unspecified: Secondary | ICD-10-CM | POA: Insufficient documentation

## 2020-04-05 DIAGNOSIS — M5489 Other dorsalgia: Secondary | ICD-10-CM | POA: Insufficient documentation

## 2020-10-23 ENCOUNTER — Other Ambulatory Visit: Payer: Self-pay | Admitting: Sports Medicine

## 2020-10-23 DIAGNOSIS — M25562 Pain in left knee: Secondary | ICD-10-CM

## 2020-10-23 DIAGNOSIS — M7632 Iliotibial band syndrome, left leg: Secondary | ICD-10-CM

## 2020-10-23 DIAGNOSIS — G8929 Other chronic pain: Secondary | ICD-10-CM

## 2020-10-26 ENCOUNTER — Other Ambulatory Visit: Payer: Self-pay | Admitting: Internal Medicine

## 2020-10-26 DIAGNOSIS — Z1231 Encounter for screening mammogram for malignant neoplasm of breast: Secondary | ICD-10-CM

## 2020-11-02 ENCOUNTER — Other Ambulatory Visit: Payer: Self-pay

## 2020-11-02 ENCOUNTER — Ambulatory Visit
Admission: RE | Admit: 2020-11-02 | Discharge: 2020-11-02 | Disposition: A | Discharge status: Home / Self Care | Payer: BC Managed Care – PPO | Source: Ambulatory Visit | Attending: Sports Medicine | Admitting: Sports Medicine

## 2020-11-02 DIAGNOSIS — G8929 Other chronic pain: Secondary | ICD-10-CM | POA: Diagnosis present

## 2020-11-02 DIAGNOSIS — M25562 Pain in left knee: Secondary | ICD-10-CM | POA: Insufficient documentation

## 2020-11-02 DIAGNOSIS — M7632 Iliotibial band syndrome, left leg: Secondary | ICD-10-CM | POA: Diagnosis present

## 2020-11-02 IMAGING — MR MR KNEE*L* W/O CM
6 series · 40 of 40 positions shown · non-contrast
Comparison: None.

CLINICAL DATA: Lateral left knee pain for 6 weeks

EXAM:
MRI OF THE LEFT KNEE WITHOUT CONTRAST
TECHNIQUE: Multiplanar, multisequence MR imaging of the knee was performed. No
intravenous contrast was administered.

[Series 4: T1 · coronal · 4.0mm · 0.62mm/px · 7 of 29 slices shown]
[im 1/29]
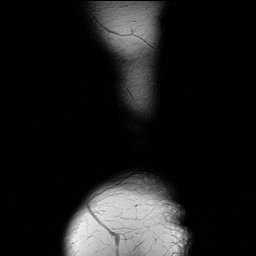
[im 5/29]
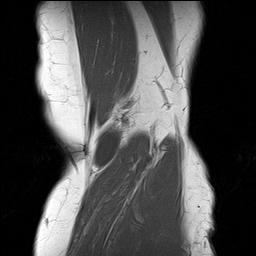
[im 10/29]
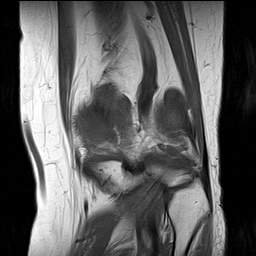
[im 15/29]
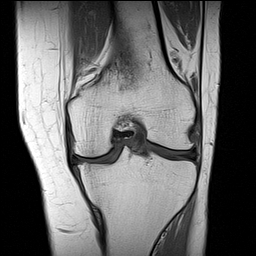
[im 19/29]
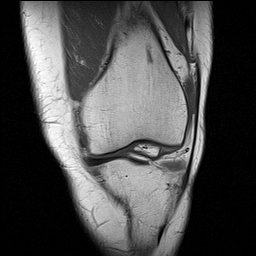
[im 24/29]
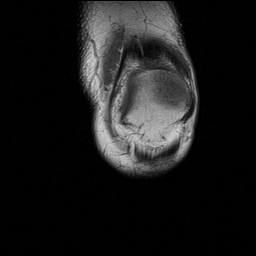
[im 29/29]
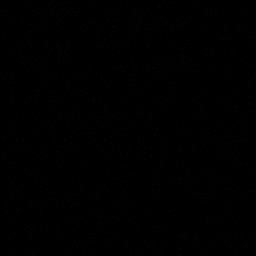

[Series 5: T2 fat-sat · coronal · 4.0mm · 0.62mm/px · 6 of 29 slices shown (1 of 3)]
[im 1/29]
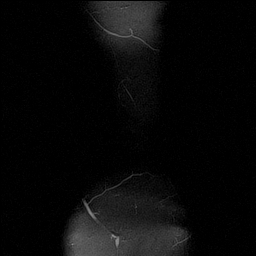
[im 6/29]
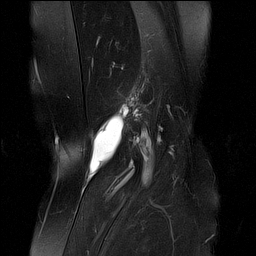
[im 12/29]
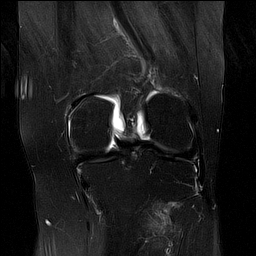
[im 17/29]
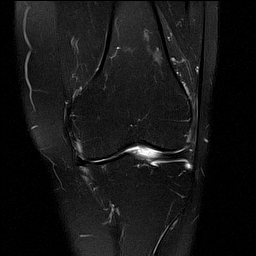
[im 23/29]
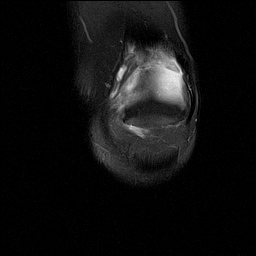
[im 29/29]
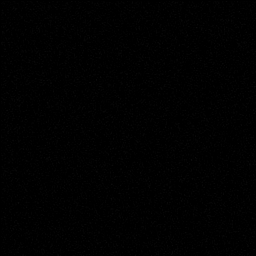

[Series 6: PD fat-sat · coronal · 4.0mm · 0.62mm/px · 6 of 29 slices shown (1 of 2)]
[im 1/29]
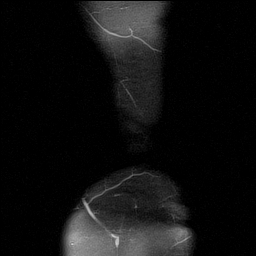
[im 6/29]
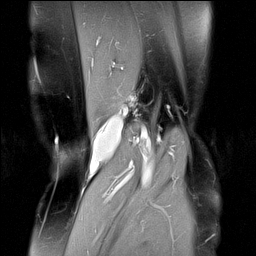
[im 12/29]
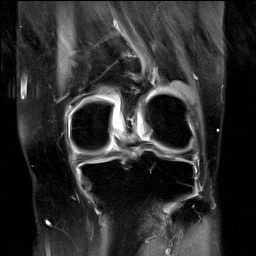
[im 17/29]
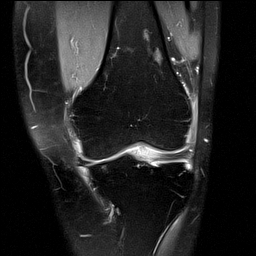
[im 23/29]
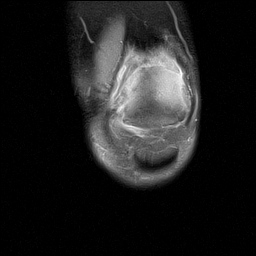
[im 29/29]
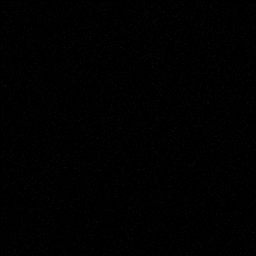

[Series 7: PD fat-sat · sagittal · 3.0mm · 0.62mm/px · 7 of 35 slices shown (2 of 2)]
[im 1/35]
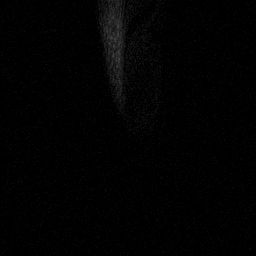
[im 6/35]
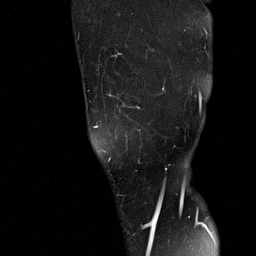
[im 12/35]
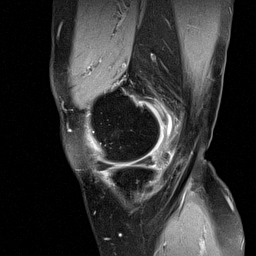
[im 18/35]
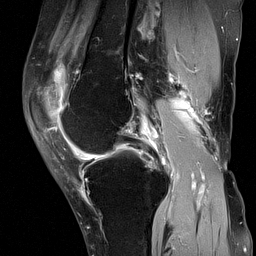
[im 23/35]
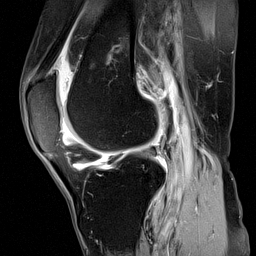
[im 29/35]
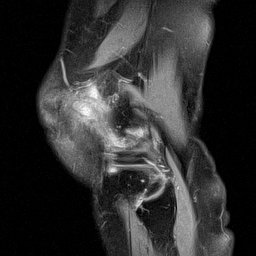
[im 35/35]
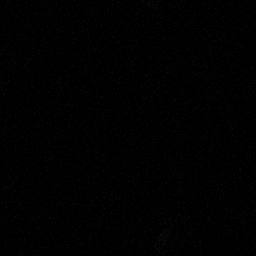

[Series 8: T2 fat-sat · sagittal · 3.0mm · 0.62mm/px · 7 of 35 slices shown (2 of 3)]
[im 1/35]
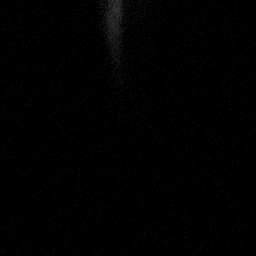
[im 6/35]
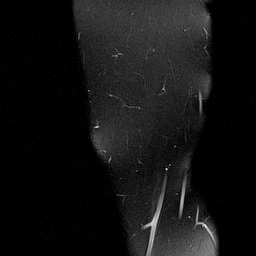
[im 12/35]
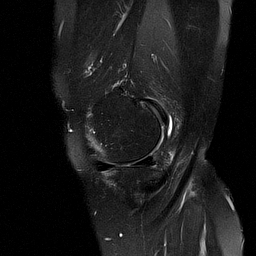
[im 18/35]
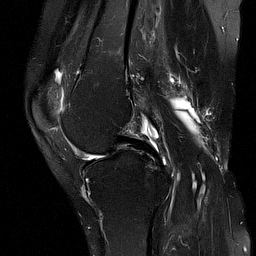
[im 23/35]
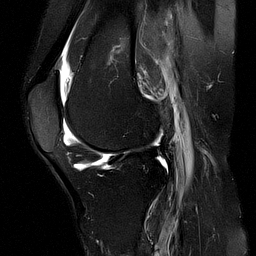
[im 29/35]
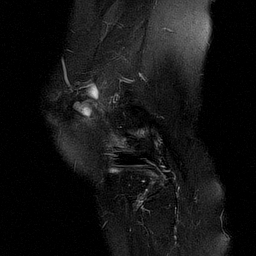
[im 35/35]
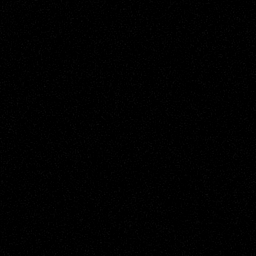

[Series 9: T2 fat-sat · axial · 4.0mm · 0.53mm/px · z∈[-85,+85]mm · 7 of 35 slices shown (3 of 3)]
[im 1/35]
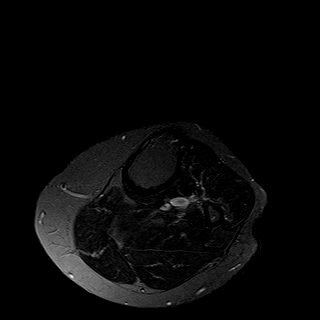
[im 6/35]
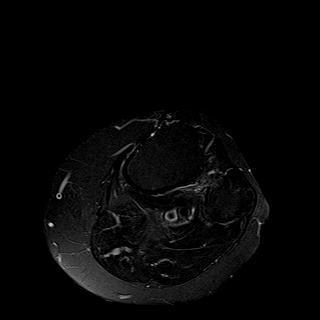
[im 12/35]
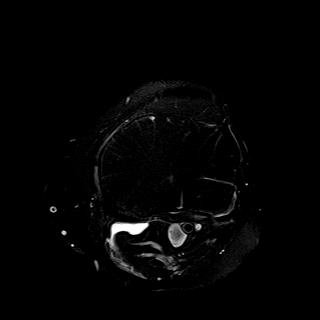
[im 18/35]
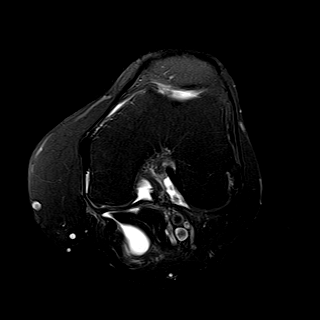
[im 23/35]
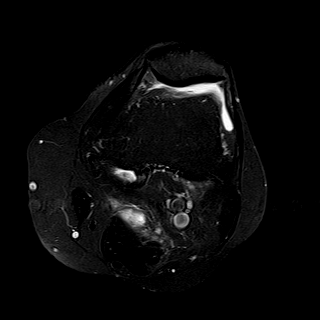
[im 29/35]
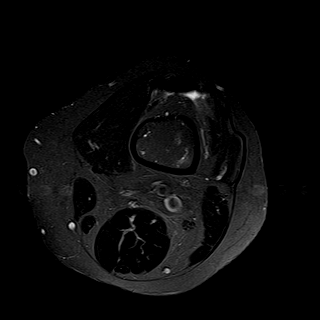
[im 35/35]
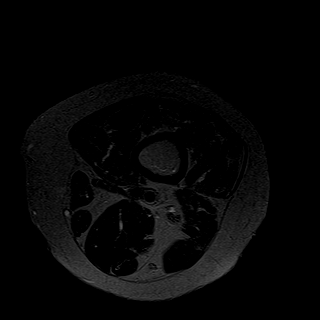

[40 of 40 positions shown; findings below may reference images not displayed]

FINDINGS: MENISCI

Medial meniscus: Intrasubstance degeneration of the medial meniscal
body and posterior horn with irregular predominantly oblique tears
extending to the inferior articular surface of the posterior horn
(series 7, images 13-14). Meniscus is mildly extruded.

Lateral meniscus: Mild intrasubstance degeneration without discrete
tear.

LIGAMENTS

Cruciates:  Intact ACL and PCL.

Collaterals: Medial collateral ligament is intact. Lateral
collateral ligament complex is intact.

CARTILAGE

Patellofemoral:  No chondral defect.

Medial: Mild chondral thinning of the weight-bearing medial
compartment.

Lateral:  No chondral defect.

Joint:  Trace knee joint effusion.  Fat pads within normal limits.

Popliteal Fossa: Moderate-sized Baker's cyst measuring up to 5.5 cm
in length. Intact popliteus tendon.

Extensor Mechanism:  Intact quadriceps tendon and patellar tendon.

Bones: No focal marrow signal abnormality. No fracture or
dislocation.

Other: None.
IMPRESSION: 1. Degeneration and tearing of the medial meniscus.
2. Moderate-sized Baker's cyst measuring up to 5.5 cm in length.
3. Mild medial compartment chondral thinning.
4. Trace knee joint effusion.

## 2020-12-25 ENCOUNTER — Other Ambulatory Visit: Payer: Self-pay

## 2020-12-25 ENCOUNTER — Ambulatory Visit
Admission: RE | Admit: 2020-12-25 | Discharge: 2020-12-25 | Disposition: A | Discharge status: Home / Self Care | Payer: BC Managed Care – PPO | Source: Ambulatory Visit | Attending: Internal Medicine | Admitting: Internal Medicine

## 2020-12-25 DIAGNOSIS — Z1231 Encounter for screening mammogram for malignant neoplasm of breast: Secondary | ICD-10-CM | POA: Diagnosis present

## 2020-12-25 IMAGING — MG DIGITAL SCREENING BILAT W/ TOMO W/ CAD
8 series · 8 of 24 positions shown · non-contrast
Comparison: Previous exam(s).

CLINICAL DATA: Screening.

EXAM:
DIGITAL SCREENING BILATERAL MAMMOGRAM WITH TOMO AND CAD

[L CC synth-2D]
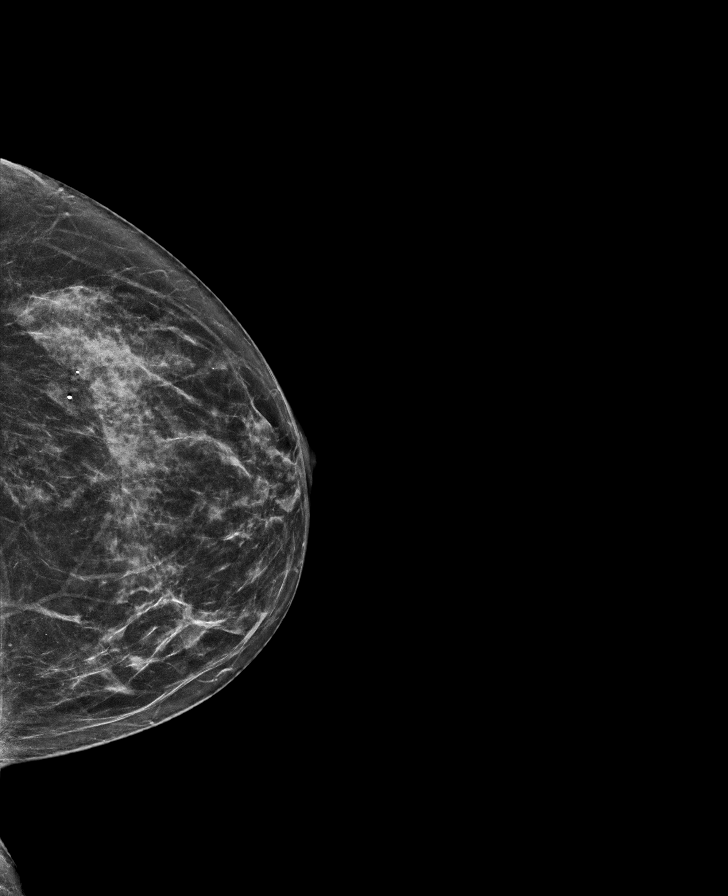

[R MLO synth-2D]
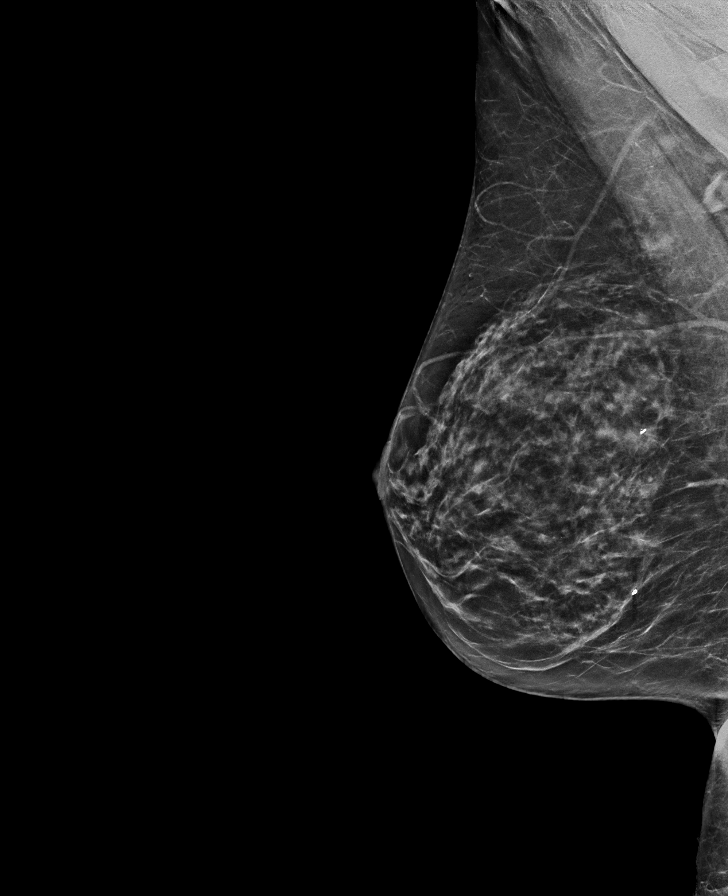

[L MLO synth-2D]
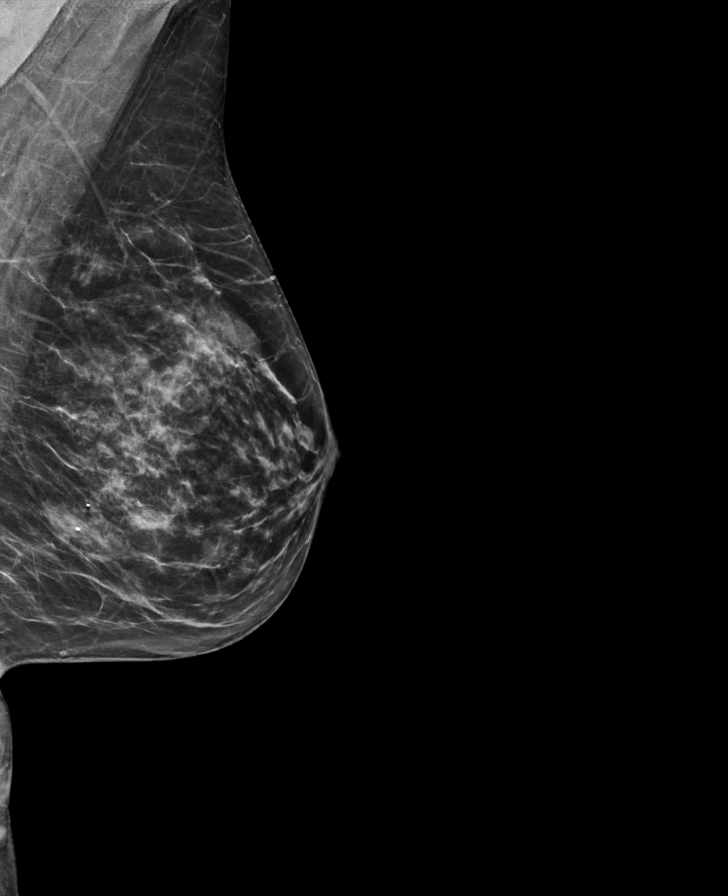

[R CC synth-2D]
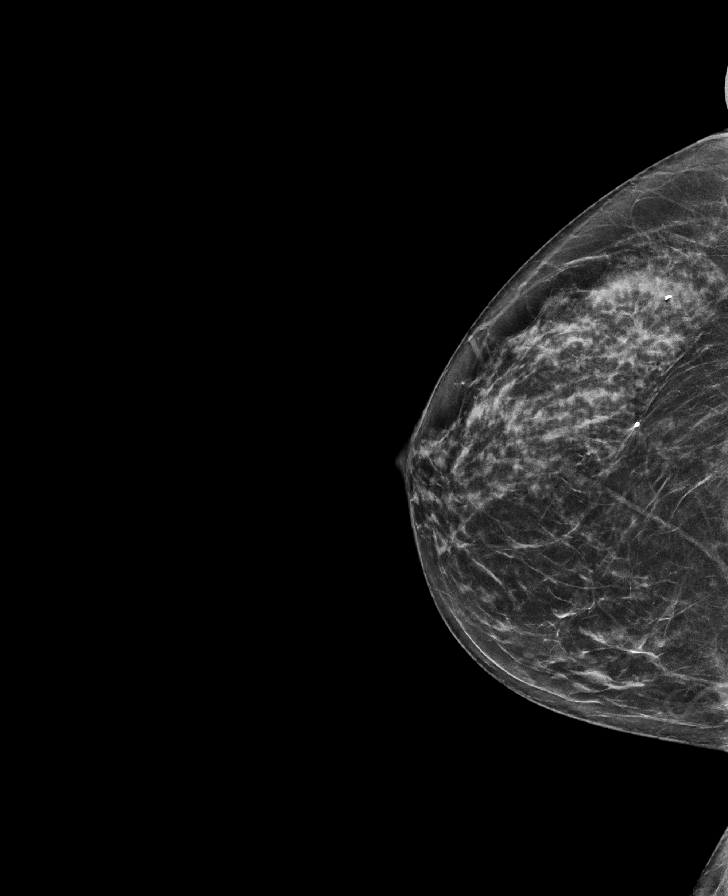

[R MLO tomo · tomo slice 33/66.0]
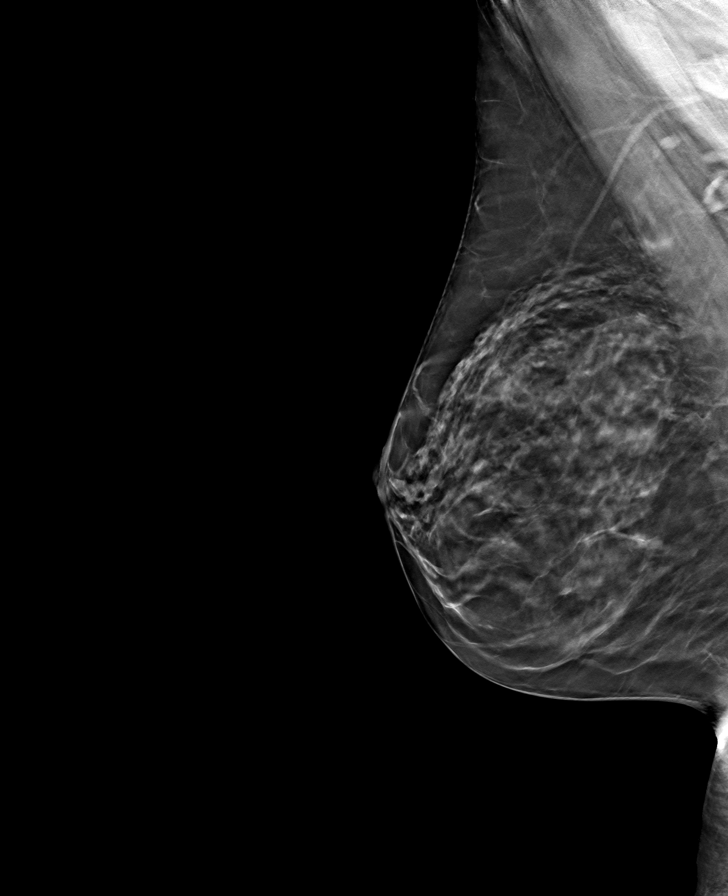

[L CC tomo · tomo slice 30/59.0]
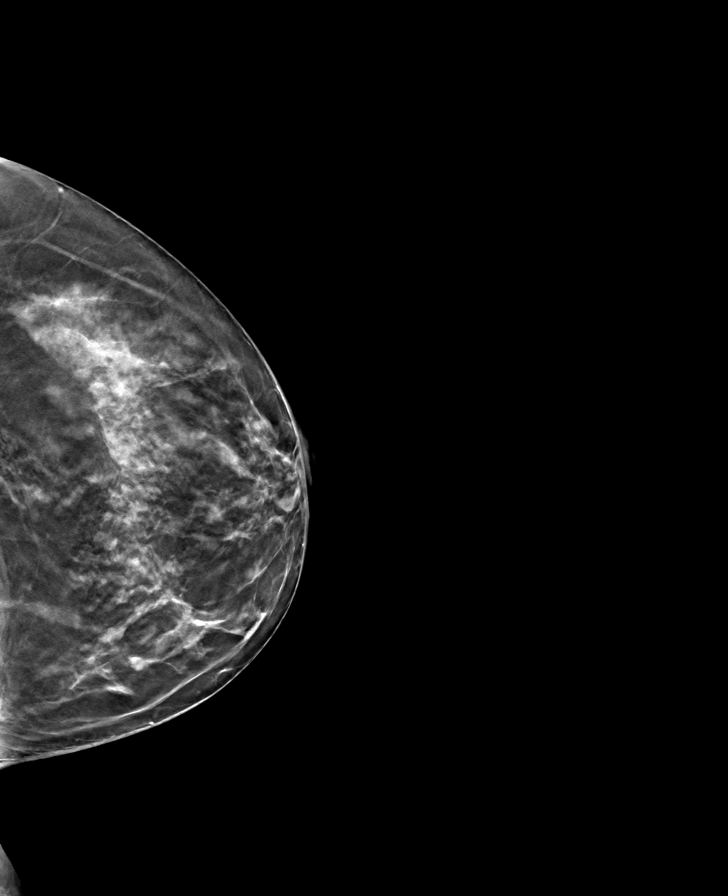

[R CC tomo · tomo slice 33/65.0]
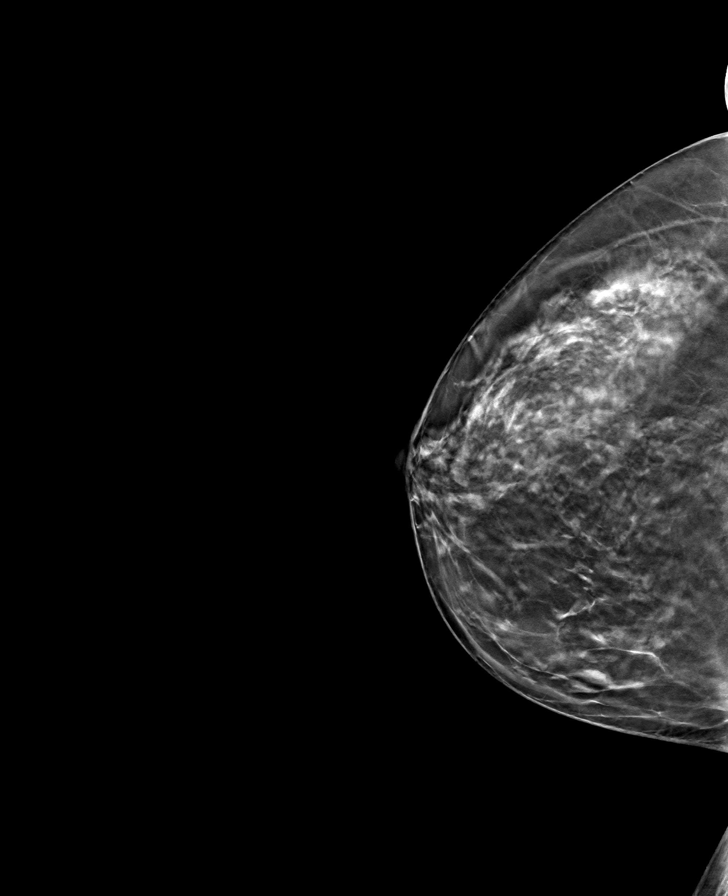

[L MLO tomo · tomo slice 33/66.0]
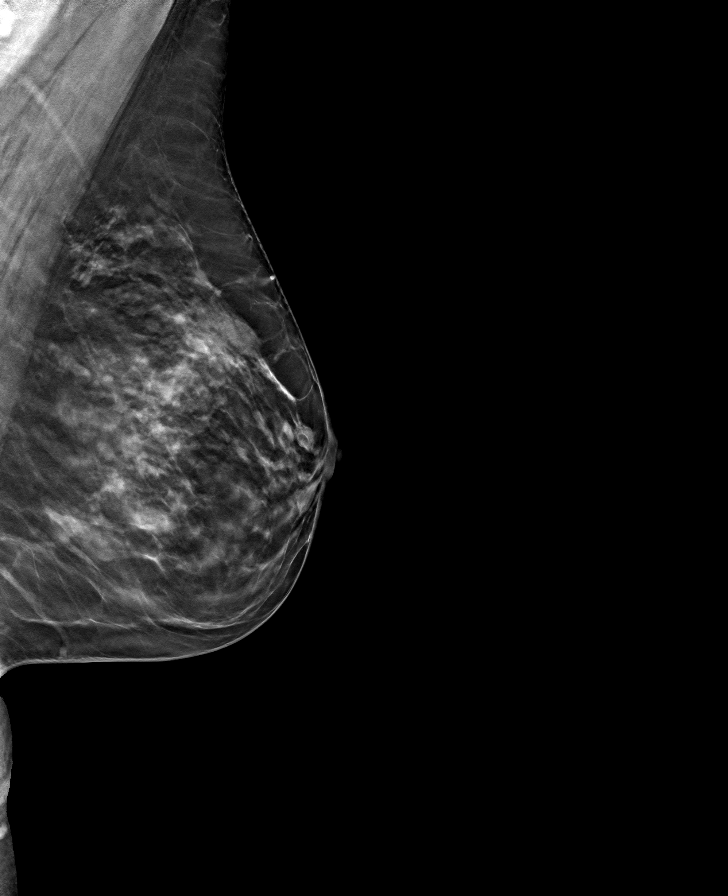

[8 of 24 positions shown; findings below may reference images not displayed]

ACR Breast Density Category c: The breast tissue is heterogeneously
dense, which may obscure small masses.
FINDINGS: There are no findings suspicious for malignancy. Images were
processed with CAD.
IMPRESSION: No mammographic evidence of malignancy. A result letter of this
screening mammogram will be mailed directly to the patient.

RECOMMENDATION:
Screening mammogram in one year. (Code:[5V])

BI-RADS CATEGORY  1: Negative.

## 2021-01-22 ENCOUNTER — Other Ambulatory Visit: Payer: Self-pay | Admitting: Orthopedic Surgery

## 2021-01-22 ENCOUNTER — Inpatient Hospital Stay: Admission: RE | Admit: 2021-01-22 | Payer: Self-pay | Source: Ambulatory Visit

## 2021-01-22 ENCOUNTER — Encounter: Payer: Self-pay | Admitting: Orthopedic Surgery

## 2021-01-22 NOTE — H&P (Deleted)
  The note originally documented on this encounter has been moved the the encounter in which it belongs.  

## 2021-01-22 NOTE — H&P (Signed)
Kathy Holt MRN:  [id number] DOB/SEX:  [birth year]/female  CHIEF COMPLAINT:  Painful left Knee  HISTORY: Patient is a 70 y.o. female presented with a history of pain in the left knee. Onset of symptoms was abrupt starting several months ago with gradually worsening course since that time. Prior procedures on the knee include none. Patient has been treated conservatively with over-the-counter NSAIDs and activity modification. Patient currently rates pain in the knee at 10 out of 10 with activity. There is no pain at night.  PAST MEDICAL HISTORY: There are no problems to display for this patient.  No past medical history on file. No past surgical history on file.   MEDICATIONS:  (Not in a hospital admission)   ALLERGIES:   Allergies  Allergen Reactions  . Latex     Contact dermitis     REVIEW OF SYSTEMS:  Pertinent items noted in HPI and remainder of comprehensive ROS otherwise negative.   FAMILY HISTORY:  No family history on file.  SOCIAL HISTORY:   Social History   Tobacco Use  . Smoking status: Not on file  . Smokeless tobacco: Not on file  Substance Use Topics  . Alcohol use: Not on file     EXAMINATION:  Vital signs in last 24 hours: @VSRANGES @  General appearance: alert and no distress Neck: no JVD and supple, symmetrical, trachea midline Lungs: clear to auscultation bilaterally Heart: regular rate and rhythm, S1, S2 normal, no murmur, click, rub or gallop Abdomen: soft, non-tender; bowel sounds normal; no masses,  no organomegaly Extremities: extremities normal, atraumatic, no cyanosis or edema and Homans sign is negative, no sign of DVT Pulses: 2+ and symmetric Skin: Skin color, texture, turgor normal. No rashes or lesions Neurologic: Alert and oriented X 3, normal strength and tone. Normal symmetric reflexes. Normal coordination and gait  Musculoskeletal:  ROM 0-115, Ligaments intact,  Imaging Review MRI LEFT KNEE Left knee medial meniscal tear.  The bone quality appears to be good for age and reported activity level.  Assessment/Plan: Left knee medial meniscal tear  The patient history, physical examination and imaging studies are consistent with medial meniscal tear of the left knee. The patient has failed conservative treatment.  The clearance notes were reviewed.  After discussion with the patient it was felt that Knee Arthroscopy was indicated. The procedure,  risks, and benefits of total knee arthroplasty were presented and reviewed. The risks including but not limited to aseptic loosening, infection, blood clots, vascular injury, stiffness, patella tracking problems complications among others were discussed. The patient acknowledged the explanation, agreed to proceed with the plan.  Carlynn Spry 01/22/2021, 4:37 PM

## 2021-01-24 ENCOUNTER — Other Ambulatory Visit: Payer: Self-pay

## 2021-01-24 ENCOUNTER — Encounter
Admission: RE | Admit: 2021-01-24 | Discharge: 2021-01-24 | Disposition: A | Discharge status: Home / Self Care | Payer: BC Managed Care – PPO | Source: Ambulatory Visit | Attending: Orthopedic Surgery | Admitting: Orthopedic Surgery

## 2021-01-24 DIAGNOSIS — Z01812 Encounter for preprocedural laboratory examination: Secondary | ICD-10-CM | POA: Insufficient documentation

## 2021-01-24 HISTORY — DX: Other cervical disc degeneration, unspecified cervical region: M50.30

## 2021-01-24 HISTORY — DX: Cardiac arrhythmia, unspecified: I49.9

## 2021-01-24 HISTORY — DX: Unspecified osteoarthritis, unspecified site: M19.90

## 2021-01-24 NOTE — Patient Instructions (Addendum)
INSTRUCTIONS FOR SURGERY     Your surgery is scheduled for:   Wednesday, February 16TH     To find out your arrival time for the day of surgery,          please call [phone] between 1 pm and 3 pm on :  Tuesday, February 15TH     When you arrive for surgery, report to the [facility].      ONCE THEY ARE FINISHED WITH THEIR PROCESS, GO TO THE SECOND FLOOR       AND SIGN IN AT THE SURGERY DESK.    REMEMBER: Instructions that are not followed completely may result in serious medical risk,  up to and including death, or upon the discretion of your surgeon and anesthesiologist,            your surgery may need to be rescheduled.  __X__ 1. Do not eat food after midnight the night before your procedure.                    No gum, candy, lozenger, tic tacs, tums or hard candies.                  ABSOLUTELY NOTHING SOLID IN YOUR MOUTH AFTER MIDNIGHT                    You may drink unlimited clear liquids up to 2 hours before you are scheduled to arrive for surgery.                   Do not drink anything within those 2 hours unless you need to take medicine, then take the                   smallest amount you need.  Clear liquids include:  water, apple juice without pulp,                   any flavor Gatorade, Black coffee, black tea.  Sugar may be added but no dairy/ honey /lemon.                        Broth and jello is not considered a clear liquid.  __x__  2. On the morning of surgery, please brush your teeth with toothpaste and water. You may rinse with                  mouthwash if you wish but DO NOT SWALLOW TOOTHPASTE OR MOUTHWASH  __X___3. NO alcohol for 24 hours before or after surgery.  __x___ 4.  Do NOT smoke or use e-cigarettes for 24 HOURS PRIOR TO SURGERY.                      DO NOT Use any chewable tobacco products for at least 6 hours prior to surgery.  __x___ 5. If you start any new  medication after this appointment and prior to surgery, please                   Bring it with you on the  day of surgery.  ___x__ 6. Notify your doctor if there is any change in your medical condition, such as fever,                  infection, vomitting, diarrhea or any open sores.  __x___ 7.  USE the CHG SOAP as instructed, the night before surgery and the day of surgery.                   Once you have washed with this soap, do NOT use any of the following: Powders, perfumes                    or lotions. Please do not wear make up, hairpins, clips or nail polish.  YOU may wear deodorant.                   Men may shave their face and neck.  Women need to shave 48 hours prior to surgery.                   DO NOT wear ANY jewelry on the day of surgery. If there are rings that are too tight to                    remove easily, please address this prior to the surgery day. Piercings need to be removed.                                                                     NO METAL ON YOUR BODY.                    Do NOT bring any valuables.  If you came to Pre-Admit testing then you will not need license,                     insurance card or credit card.  If you will be staying overnight, please either leave your things in                     the car or have your family be responsible for these items.                     [facility] IS NOT RESPONSIBLE FOR BELONGINGS OR VALUABLES.  ___X__ 8. DO NOT wear contact lenses on surgery day.  You may not have dentures,                     Hearing aides, contacts or glasses in the operating room. These items can be                    Placed in the Recovery Room to receive immediately after surgery.  __x___ 9. IF YOU ARE SCHEDULED TO GO HOME ON THE SAME DAY, YOU MUST                   Have someone to drive you home and to stay with you  for the first 24 hours.                    Have an  arrangement prior to arriving on surgery day.  ___x__ 10. Take the  following medications on the morning of surgery with a sip of water:                              1.  NOTHING                     2.                      __X__  12. STOP ALL ASPIRIN PRODUCTS AS OF TODAY, February 10TH                       THIS INCLUDES BC POWDERS / GOODIES POWDER                     STOP [place] AFTER YOUR LAST DOSE ON Saturday, February 12TH  __x___ 13. STOP Anti-inflammatories as OF TODAY, February 10TH                      This includes IBUPROFEN / MOTRIN / ADVIL / ALEVE/ NAPROXYN                    YOU MAY TAKE TYLENOL ANY TIME PRIOR TO SURGERY.  ___X__ 14.  Stop supplements until after surgery.                     This includes:  CALCIUM                 You may continue taking Vitamin B12 / Vitamin D3 but do not take on the morning of surgery.  ___X___18. Wear clean and comfortable clothing to the hospital.  [place] ON THE DAY AFTER SURGERY, UNLESS,    DR. Harlow Mares TELLS YOU DIFFERENTLY.

## 2021-01-28 ENCOUNTER — Other Ambulatory Visit: Payer: Self-pay

## 2021-01-28 ENCOUNTER — Other Ambulatory Visit
Admission: RE | Admit: 2021-01-28 | Discharge: 2021-01-28 | Disposition: A | Discharge status: Home / Self Care | Payer: BC Managed Care – PPO | Source: Ambulatory Visit | Attending: Orthopedic Surgery | Admitting: Orthopedic Surgery

## 2021-01-28 DIAGNOSIS — M23252 Derangement of posterior horn of lateral meniscus due to old tear or injury, left knee: Secondary | ICD-10-CM | POA: Diagnosis not present

## 2021-01-28 DIAGNOSIS — Z91040 Latex allergy status: Secondary | ICD-10-CM | POA: Diagnosis not present

## 2021-01-28 DIAGNOSIS — Z01812 Encounter for preprocedural laboratory examination: Secondary | ICD-10-CM | POA: Insufficient documentation

## 2021-01-28 DIAGNOSIS — Z20822 Contact with and (suspected) exposure to covid-19: Secondary | ICD-10-CM | POA: Insufficient documentation

## 2021-01-28 DIAGNOSIS — M23222 Derangement of posterior horn of medial meniscus due to old tear or injury, left knee: Secondary | ICD-10-CM | POA: Diagnosis present

## 2021-01-28 DIAGNOSIS — M94262 Chondromalacia, left knee: Secondary | ICD-10-CM | POA: Diagnosis not present

## 2021-01-28 LAB — SARS CORONAVIRUS 2 (TAT 6-24 HRS): SARS Coronavirus 2: NEGATIVE

## 2021-01-29 MED ORDER — CHLORHEXIDINE GLUCONATE 0.12 % MT SOLN: 15.0000 mL | Freq: Once | OROMUCOSAL | Status: AC

## 2021-01-29 MED ORDER — CEFAZOLIN SODIUM-DEXTROSE 2-4 GM/100ML-% IV SOLN
2.0000 g | INTRAVENOUS | Status: AC
  Administered 2021-01-30: 2 g via INTRAVENOUS

## 2021-01-29 MED ORDER — LACTATED RINGERS IV SOLN: INTRAVENOUS | Status: DC

## 2021-01-29 MED ORDER — ORAL CARE MOUTH RINSE: 15.0000 mL | Freq: Once | OROMUCOSAL | Status: AC

## 2021-01-29 MED ORDER — FAMOTIDINE 20 MG PO TABS: 20.0000 mg | ORAL_TABLET | Freq: Once | ORAL | Status: AC

## 2021-01-30 ENCOUNTER — Ambulatory Visit: Payer: BC Managed Care – PPO | Admitting: Urgent Care

## 2021-01-30 ENCOUNTER — Encounter
Admission: RE | Disposition: A | Discharge status: Home / Self Care | Payer: Self-pay | Source: Home / Self Care | Attending: Orthopedic Surgery

## 2021-01-30 ENCOUNTER — Encounter: Payer: Self-pay | Admitting: Orthopedic Surgery

## 2021-01-30 ENCOUNTER — Ambulatory Visit
Admission: RE | Admit: 2021-01-30 | Discharge: 2021-01-30 | Disposition: A | Discharge status: Home / Self Care | Payer: BC Managed Care – PPO | Attending: Orthopedic Surgery | Admitting: Orthopedic Surgery

## 2021-01-30 DIAGNOSIS — Z20822 Contact with and (suspected) exposure to covid-19: Secondary | ICD-10-CM | POA: Insufficient documentation

## 2021-01-30 DIAGNOSIS — M23222 Derangement of posterior horn of medial meniscus due to old tear or injury, left knee: Secondary | ICD-10-CM | POA: Diagnosis not present

## 2021-01-30 DIAGNOSIS — Z91040 Latex allergy status: Secondary | ICD-10-CM | POA: Insufficient documentation

## 2021-01-30 DIAGNOSIS — M94262 Chondromalacia, left knee: Secondary | ICD-10-CM | POA: Insufficient documentation

## 2021-01-30 DIAGNOSIS — M23252 Derangement of posterior horn of lateral meniscus due to old tear or injury, left knee: Secondary | ICD-10-CM | POA: Insufficient documentation

## 2021-01-30 HISTORY — PX: KNEE ARTHROSCOPY WITH MEDIAL MENISECTOMY: SHX5651

## 2021-01-30 SURGERY — ARTHROSCOPY, KNEE, WITH MEDIAL MENISCECTOMY
Anesthesia: General | Site: Knee | Laterality: Left

## 2021-01-30 MED ORDER — MIDAZOLAM HCL 2 MG/2ML IJ SOLN
INTRAMUSCULAR | Status: AC
  Filled 2021-01-30: qty 2

## 2021-01-30 MED ORDER — EPINEPHRINE PF 1 MG/ML IJ SOLN
INTRAMUSCULAR | Status: AC
  Filled 2021-01-30: qty 4

## 2021-01-30 MED ORDER — HYDROCODONE-ACETAMINOPHEN 5-325 MG PO TABS: 1.0000 | ORAL_TABLET | ORAL | 0 refills | Status: AC | PRN

## 2021-01-30 MED ORDER — MIDAZOLAM HCL 2 MG/2ML IJ SOLN
INTRAMUSCULAR | Status: DC | PRN
  Administered 2021-01-30: 2 mg via INTRAVENOUS

## 2021-01-30 MED ORDER — ACETAMINOPHEN 325 MG PO TABS: 325.0000 mg | ORAL_TABLET | Freq: Four times a day (QID) | ORAL | Status: DC | PRN

## 2021-01-30 MED ORDER — FENTANYL CITRATE (PF) 250 MCG/5ML IJ SOLN
INTRAMUSCULAR | Status: AC
  Filled 2021-01-30: qty 5

## 2021-01-30 MED ORDER — ROPIVACAINE HCL 5 MG/ML IJ SOLN
INTRAMUSCULAR | Status: DC | PRN
  Administered 2021-01-30: 20 mL

## 2021-01-30 MED ORDER — DEXAMETHASONE SODIUM PHOSPHATE 10 MG/ML IJ SOLN
INTRAMUSCULAR | Status: DC | PRN
  Administered 2021-01-30: 10 mg via INTRAVENOUS

## 2021-01-30 MED ORDER — ONDANSETRON HCL 4 MG/2ML IJ SOLN
INTRAMUSCULAR | Status: DC | PRN
  Administered 2021-01-30: 4 mg via INTRAVENOUS

## 2021-01-30 MED ORDER — METHYLPREDNISOLONE ACETATE 40 MG/ML IJ SUSP
INTRAMUSCULAR | Status: DC | PRN
  Administered 2021-01-30: 40 mg

## 2021-01-30 MED ORDER — PROPOFOL 10 MG/ML IV BOLUS
INTRAVENOUS | Status: DC | PRN
  Administered 2021-01-30: 150 mg via INTRAVENOUS

## 2021-01-30 MED ORDER — PROPOFOL 10 MG/ML IV BOLUS
INTRAVENOUS | Status: AC
  Filled 2021-01-30: qty 20

## 2021-01-30 MED ORDER — FAMOTIDINE 20 MG PO TABS
ORAL_TABLET | ORAL | Status: AC
  Administered 2021-01-30: 20 mg via ORAL
  Filled 2021-01-30: qty 1

## 2021-01-30 MED ORDER — MORPHINE SULFATE (PF) 2 MG/ML IV SOLN: 0.5000 mg | INTRAVENOUS | Status: DC | PRN

## 2021-01-30 MED ORDER — CHLORHEXIDINE GLUCONATE 0.12 % MT SOLN
OROMUCOSAL | Status: AC
  Administered 2021-01-30: 15 mL via OROMUCOSAL
  Filled 2021-01-30: qty 15

## 2021-01-30 MED ORDER — KETOROLAC TROMETHAMINE 15 MG/ML IJ SOLN: 15.0000 mg | Freq: Four times a day (QID) | INTRAMUSCULAR | Status: DC

## 2021-01-30 MED ORDER — LIDOCAINE HCL (PF) 2 % IJ SOLN
INTRAMUSCULAR | Status: AC
  Filled 2021-01-30: qty 5

## 2021-01-30 MED ORDER — DEXAMETHASONE SODIUM PHOSPHATE 10 MG/ML IJ SOLN
INTRAMUSCULAR | Status: AC
  Filled 2021-01-30: qty 1

## 2021-01-30 MED ORDER — LACTATED RINGERS IV SOLN
INTRAVENOUS | Status: DC | PRN
  Administered 2021-01-30: 3001 mL

## 2021-01-30 MED ORDER — BUPIVACAINE-EPINEPHRINE (PF) 0.25% -1:200000 IJ SOLN
INTRAMUSCULAR | Status: DC | PRN
  Administered 2021-01-30: 10 mL

## 2021-01-30 MED ORDER — ROPIVACAINE HCL 5 MG/ML IJ SOLN
INTRAMUSCULAR | Status: AC
  Filled 2021-01-30: qty 20

## 2021-01-30 MED ORDER — HYDROCODONE-ACETAMINOPHEN 5-325 MG PO TABS: 1.0000 | ORAL_TABLET | ORAL | Status: DC | PRN

## 2021-01-30 MED ORDER — LIDOCAINE HCL (CARDIAC) PF 100 MG/5ML IV SOSY
PREFILLED_SYRINGE | INTRAVENOUS | Status: DC | PRN
  Administered 2021-01-30: 50 mg via INTRAVENOUS

## 2021-01-30 MED ORDER — ONDANSETRON HCL 4 MG PO TABS: 4.0000 mg | ORAL_TABLET | Freq: Four times a day (QID) | ORAL | Status: DC | PRN

## 2021-01-30 MED ORDER — ONDANSETRON HCL 4 MG/2ML IJ SOLN
INTRAMUSCULAR | Status: AC
  Filled 2021-01-30: qty 2

## 2021-01-30 MED ORDER — LACTATED RINGERS IV SOLN: INTRAVENOUS | Status: DC

## 2021-01-30 MED ORDER — HYDROCODONE-ACETAMINOPHEN 7.5-325 MG PO TABS
1.0000 | ORAL_TABLET | ORAL | Status: DC | PRN
  Filled 2021-01-30: qty 2

## 2021-01-30 MED ORDER — ONDANSETRON HCL 4 MG/2ML IJ SOLN: 4.0000 mg | Freq: Four times a day (QID) | INTRAMUSCULAR | Status: DC | PRN

## 2021-01-30 MED ORDER — METHYLPREDNISOLONE ACETATE 40 MG/ML IJ SUSP
INTRAMUSCULAR | Status: AC
  Filled 2021-01-30: qty 1

## 2021-01-30 MED ORDER — METOCLOPRAMIDE HCL 5 MG/ML IJ SOLN: 5.0000 mg | Freq: Three times a day (TID) | INTRAMUSCULAR | Status: DC | PRN

## 2021-01-30 MED ORDER — ACETAMINOPHEN 10 MG/ML IV SOLN
INTRAVENOUS | Status: DC | PRN
  Administered 2021-01-30: 1000 mg via INTRAVENOUS

## 2021-01-30 MED ORDER — CEFAZOLIN SODIUM-DEXTROSE 2-4 GM/100ML-% IV SOLN
INTRAVENOUS | Status: AC
  Filled 2021-01-30: qty 100

## 2021-01-30 MED ORDER — ACETAMINOPHEN 10 MG/ML IV SOLN
INTRAVENOUS | Status: AC
  Filled 2021-01-30: qty 100

## 2021-01-30 MED ORDER — BUPIVACAINE-EPINEPHRINE (PF) 0.25% -1:200000 IJ SOLN
INTRAMUSCULAR | Status: AC
  Filled 2021-01-30: qty 30

## 2021-01-30 MED ORDER — FENTANYL CITRATE (PF) 100 MCG/2ML IJ SOLN
INTRAMUSCULAR | Status: DC | PRN
  Administered 2021-01-30 (×3): 50 ug via INTRAVENOUS

## 2021-01-30 MED ORDER — METOCLOPRAMIDE HCL 10 MG PO TABS: 5.0000 mg | ORAL_TABLET | Freq: Three times a day (TID) | ORAL | Status: DC | PRN

## 2021-01-30 SURGICAL SUPPLY — 39 items
ADAPTER IRRIG TUBE 2 SPIKE SOL (ADAPTER) ×4 IMPLANT
BLADE FULL RADIUS 3.5 (BLADE) IMPLANT
BLADE INCISOR PLUS 4.5 (BLADE) ×2 IMPLANT
BLADE SHAVER 4.5 DBL SERAT CV (CUTTER) IMPLANT
BLADE SURG SZ11 CARB STEEL (BLADE) ×2 IMPLANT
BRUSH SCRUB EZ  4% CHG (MISCELLANEOUS) ×4
BRUSH SCRUB EZ 4% CHG (MISCELLANEOUS) ×2 IMPLANT
CHLORAPREP W/TINT 26 (MISCELLANEOUS) ×2 IMPLANT
COOLER POLAR GLACIER W/PUMP (MISCELLANEOUS) ×2 IMPLANT
COVER WAND RF STERILE (DRAPES) ×2 IMPLANT
DRAPE SPLIT 6X30 W/TAPE (DRAPES) ×2 IMPLANT
GAUZE SPONGE 4X4 12PLY STRL (GAUZE/BANDAGES/DRESSINGS) ×2 IMPLANT
GAUZE XEROFORM 1X8 LF (GAUZE/BANDAGES/DRESSINGS) ×2 IMPLANT
GLOVE INDICATOR 8.0 STRL GRN (GLOVE) ×2 IMPLANT
GLOVE SURG ORTHO LTX SZ8 (GLOVE) ×2 IMPLANT
GOWN STRL REUS W/ TWL LRG LVL3 (GOWN DISPOSABLE) ×1 IMPLANT
GOWN STRL REUS W/ TWL XL LVL3 (GOWN DISPOSABLE) ×1 IMPLANT
GOWN STRL REUS W/TWL LRG LVL3 (GOWN DISPOSABLE) ×2
GOWN STRL REUS W/TWL XL LVL3 (GOWN DISPOSABLE) ×2
IV LACTATED RINGER IRRG 3000ML (IV SOLUTION) ×8
IV LR IRRIG 3000ML ARTHROMATIC (IV SOLUTION) ×4 IMPLANT
KIT TURNOVER KIT A (KITS) ×2 IMPLANT
MANIFOLD NEPTUNE II (INSTRUMENTS) ×4 IMPLANT
MAT ABSORB  FLUID 56X50 GRAY (MISCELLANEOUS) ×2
MAT ABSORB FLUID 56X50 GRAY (MISCELLANEOUS) ×1 IMPLANT
NDL HPO THNWL 1X22GA REG BVL (NEEDLE) IMPLANT
NDL SPNL 20GX3.5 QUINCKE YW (NEEDLE) ×1 IMPLANT
NEEDLE SAFETY 22GX1 (NEEDLE) ×2
NEEDLE SPNL 20GX3.5 QUINCKE YW (NEEDLE) ×2 IMPLANT
PACK ARTHROSCOPY KNEE (MISCELLANEOUS) ×2 IMPLANT
PAD ABD DERMACEA PRESS 5X9 (GAUZE/BANDAGES/DRESSINGS) ×4 IMPLANT
PAD WRAPON POLAR KNEE (MISCELLANEOUS) ×1 IMPLANT
SUT ETHILON 4-0 (SUTURE) ×2
SUT ETHILON 4-0 FS2 18XMFL BLK (SUTURE) ×1
SUTURE ETHLN 4-0 FS2 18XMF BLK (SUTURE) ×1 IMPLANT
SYR 30ML LL (SYRINGE) ×1 IMPLANT
TUBING ARTHRO INFLOW-ONLY STRL (TUBING) ×2 IMPLANT
WAND WEREWOLF FLOW 90D (MISCELLANEOUS) ×2 IMPLANT
WRAPON POLAR PAD KNEE (MISCELLANEOUS) ×2

## 2021-01-30 NOTE — H&P (Signed)
The patient has been re-examined, and the chart reviewed, and there have been no interval changes to the documented history and physical.  Plan a left knee scope today.  Anesthesia is not consulted regarding a peripheral nerve block for post-operative pain.  The risks, benefits, and alternatives have been discussed at length, and the patient is willing to proceed.     

## 2021-01-30 NOTE — Discharge Instructions (Signed)
AMBULATORY SURGERY  DISCHARGE INSTRUCTIONS   1) The drugs that you were given will stay in your system until tomorrow so for the next 24 hours you should not:  A) Drive an automobile B) Make any legal decisions C) Drink any alcoholic beverage   2) You may resume regular meals tomorrow.  Today it is better to start with liquids and gradually work up to solid foods.  You may eat anything you prefer, but it is better to start with liquids, then soup and crackers, and gradually work up to solid foods.   3) Please notify your doctor immediately if you have any unusual bleeding, trouble breathing, redness and pain at the surgery site, drainage, fever, or pain not relieved by medication.    4) Additional Instructions:        Please contact your physician with any problems or Same Day Surgery at [phone], Monday through Friday 6 am to 4 pm, or [place] at [facility] number at [phone].  Post Op Home Instructions for Knee Arthroscopy  PATIENT ALREADY HAS FOLLOW-UP AND PHYSICAL THERAPY SCHEDULED  1) Do not sit for longer than 1 hour at a time with your leg dangling down.  You should have your legs elevated (higher than your heart) in a recliner chair or couch.  2) You may be up walking around as tolerated but should take periodic breaks to elevate your legs.  Discontinue use of crutches when you feel you are able to walk without pain or a limp.  3) Work on gentle bending and straightening of the knee.  4) You may remove the Ace wrap and dressings two days after surgery.  Place band aids over the incision sites.  5) You may shower after you remove the surgical dressing.  You do not need to cover the incision with plastic wrap.  The incision can get wet, but do not submerge under water.  After your sutures have been removed, you should wait 24 hours before submerging incision under water.  6) Pain medication can cause constipation.  You should increase your fluid intake,  increase your intake of high fiber foods and/or take Metamucil as needed for constipation.  7) Continue your physical therapy exercises, as shown at the office, at least twice daily.  You should set up outpatient physical therapy and start within the first week after surgery.  8) Continue to use your Polar Pack continuously for 2-3 days after surgery.  After you remove the surgical dressing, it is a good idea to use your Polar Pack or ice pack for 30 minutes after doing your exercises to reduce swelling.  9) Do not be surprised if you have increased pain at night.  This usually means you have been a little too active during the day and need to reduce your activities.  10) If you develop lower extremity swelling that does not improve after a night of elevation, please call the office.  This could be an early sign of a blood clot.  Please call with any questions at [phone]

## 2021-01-30 NOTE — Anesthesia Procedure Notes (Signed)
Procedure Name: LMA Insertion Performed by: Gaynelle Cage, CRNA Pre-anesthesia Checklist: Patient identified, Emergency Drugs available, Suction available and Patient being monitored Patient Re-evaluated:Patient Re-evaluated prior to induction Oxygen Delivery Method: Circle system utilized Preoxygenation: Pre-oxygenation with 100% oxygen Induction Type: IV induction LMA: LMA inserted LMA Size: 3.0 Tube type: Oral Number of attempts: 1 Placement Confirmation: positive ETCO2 and breath sounds checked- equal and bilateral Dental Injury: Teeth and Oropharynx as per pre-operative assessment

## 2021-01-30 NOTE — Op Note (Signed)
  PATIENT:  Iola K Feld  PRE-OPERATIVE DIAGNOSIS:  Meniscal tear, left knee  POST-OPERATIVE DIAGNOSIS:  Same  PROCEDURE:  LEFT KNEE ARTHROSCOPY WITH PARTIAL MEDIAL AND LATERAL MENISECTOMY, PARTIAL SYNOVECTOMY  SURGEON:  Kurtis Bushman, MD  ANESTHESIA:   General  PREOPERATIVE INDICATIONS:  Kathy Holt  70 y.o. female with a diagnosis of meniscal tear who failed conservative management and elected for surgical management.    The risks benefits and alternatives were discussed with the patient preoperatively including the risks of infection, bleeding, nerve injury, knee stiffness, persistent pain, osteoarthritis and the need for further surgery. Medical  risks include DVT and pulmonary embolism, myocardial infarction, stroke, pneumonia, respiratory failure and death. The patient understood these risks and wished to proceed.   OPERATIVE FINDINGS: The suprapatellar pouch, medial and lateral gutters were inspected and found to be free of any loose bodies. The undersurface of the patella and landing zone were inspected and grade 1 chondromalacia was noted. There was no evidence of lateral subluxation. The medial compartment showed a complex tear of the posterior horn, then anterior horn was intact and stable to probing. Grade 2 chondromalacia was identified in the medial compartment.  The notch was inspected and the ACL and PCL were intact and stable to probing. The lateral compartment was entered and minimal degenerative changes were identified. The lateral meniscus had significant degenerative tearing along the posterior horn. The anterior horn was intact a stable.  OPERATIVE PROCEDURE: Patient was met in the preoperative area. The operative extremity was signed with my initials according the hospital's correct site of surgery protocol.  The patient was brought to the operating room where they was placed supine on the operative table. General anesthesia was administered. The patient was  prepped and draped in a sterile fashion.  A timeout was performed to verify the patient's name, date of birth, medical record number, correct site of surgery correct procedure to be performed. It was also used to verify the patient received antibiotics that all appropriate instruments, and radiographic studies were available in the room. Once all in attendance were in agreement, the case began.  Proposed arthroscopy incisions were drawn out with a surgical marker. These were pre-injected with 0.5% marcaine with epinephrine. An 11 blade was used to establish an inferior lateral and inferomedial portals. The inferomedial portal was created using a 18-gauge spinal needle under direct visualization.  A full diagnostic examination of the knee was performed, please see findings for a complete list of results.  Patient had the meniscal tear treated with a 4-0 resector shaver blade and straight duckbill basket. The meniscus was debrided until a stable rim was achieved. A chondroplasty was performed in the medial compartment using the shaver on reverse burr mode. A partial synovectomy was also performed in all three compartments using a 4-0 resector shaver blade and electrocautery.  The knee was then copiously lavaged. All arthroscopic instruments were removed. The 2 arthroscopy portals were closed with 4-0 nylon. A dry sterile and compressive dressing was applied. The patient was brought to the PACU in stable condition. I was scrubbed and present for the entire case and all sharp and instrument counts were correct at the conclusion the case. I spoke with the patient's family postoperatively to let them know the case was performed without complication and the patient was stable in the recovery room.  Kurtis Bushman, MD

## 2021-01-30 NOTE — Transfer of Care (Signed)
Immediate Anesthesia Transfer of Care Note  Patient: Kathy Holt  Procedure(s) Performed: KNEE ARTHROSCOPY WITH PARTIAL MEDIAL/LATERAL MENISECTOMY, PARTIAL SYNOVECTOMY (Left Knee)  Patient Location: PACU  Anesthesia Type:General  Level of Consciousness: sedated  Airway & Oxygen Therapy: Patient Spontanous Breathing and Patient connected to face mask oxygen  Post-op Assessment: Report given to RN and Post -op Vital signs reviewed and stable  Post vital signs: Reviewed and stable  Last Vitals:  Vitals Value Taken Time  BP 102/51 01/30/21 1518  Temp    Pulse 55 01/30/21 1520  Resp 5 01/30/21 1520  SpO2 97 % 01/30/21 1520  Vitals shown include unvalidated device data.  Last Pain:  Vitals:   01/30/21 1213  PainSc: 0-No pain         Complications: No complications documented.

## 2021-01-31 ENCOUNTER — Encounter: Payer: Self-pay | Admitting: Orthopedic Surgery

## 2021-02-03 NOTE — Anesthesia Postprocedure Evaluation (Signed)
Anesthesia Post Note  Patient: Kathy Holt  Procedure(s) Performed: KNEE ARTHROSCOPY WITH PARTIAL MEDIAL/LATERAL MENISECTOMY, PARTIAL SYNOVECTOMY (Left Knee)  Patient location during evaluation: PACU Anesthesia Type: General Level of consciousness: awake and alert Pain management: pain level controlled Vital Signs Assessment: post-procedure vital signs reviewed and stable Respiratory status: spontaneous breathing, nonlabored ventilation, respiratory function stable and patient connected to nasal cannula oxygen Cardiovascular status: blood pressure returned to baseline and stable Postop Assessment: no apparent nausea or vomiting Anesthetic complications: no   No complications documented.   Last Vitals:  Vitals:   01/30/21 1641 01/30/21 1703  BP: (!) 144/84 (!) 145/63  Pulse: (!) 54 (!) 56  Resp: 18   Temp: (!) 35.8 C   SpO2: 100% 100%    Last Pain:  Vitals:   01/30/21 1703  TempSrc:   PainSc: 0-No pain                 Molli Barrows

## 2021-02-03 NOTE — Anesthesia Preprocedure Evaluation (Signed)
Anesthesia Evaluation  Patient identified by MRN, date of birth, ID band Patient awake    Reviewed: Allergy & Precautions, H&P , NPO status , Patient's Chart, lab work & pertinent test results, reviewed documented beta blocker date and time   Airway Mallampati: II   Neck ROM: full    Dental  (+) Poor Dentition   Pulmonary neg pulmonary ROS,    Pulmonary exam normal        Cardiovascular negative cardio ROS Normal cardiovascular exam Rhythm:regular Rate:Normal     Neuro/Psych negative neurological ROS  negative psych ROS   GI/Hepatic negative GI ROS, Neg liver ROS,   Endo/Other  negative endocrine ROS  Renal/GU negative Renal ROS  negative genitourinary   Musculoskeletal   Abdominal   Peds  Hematology negative hematology ROS (+)   Anesthesia Other Findings Past Medical History: No date: Arthritis     Comment:  lower back No date: DDD (degenerative disc disease), cervical No date: Dysrhythmia     Comment:  Afib Past Surgical History: No date: ABDOMINAL HYSTERECTOMY     Comment:  partial 1998: BLADDER SUSPENSION No date: COLONOSCOPY 01/30/2021: KNEE ARTHROSCOPY WITH MEDIAL MENISECTOMY; Left     Comment:  Procedure: KNEE ARTHROSCOPY WITH PARTIAL MEDIAL/LATERAL               MENISECTOMY, PARTIAL SYNOVECTOMY;  Surgeon: Lovell Sheehan, MD;  Location: [facility] ORS;  Service: Orthopedics;                Laterality: Left;   Reproductive/Obstetrics negative OB ROS                             Anesthesia Physical Anesthesia Plan  ASA: III  Anesthesia Plan: General   Post-op Pain Management:    Induction:   PONV Risk Score and Plan:   Airway Management Planned:   Additional Equipment:   Intra-op Plan:   Post-operative Plan:   Informed Consent: I have reviewed the patients History and Physical, chart, labs and discussed the procedure including the risks, benefits and  alternatives for the proposed anesthesia with the patient or authorized representative who has indicated his/her understanding and acceptance.     Dental Advisory Given  Plan Discussed with: CRNA  Anesthesia Plan Comments:         Anesthesia Quick Evaluation

## 2021-06-12 ENCOUNTER — Ambulatory Visit: Payer: BC Managed Care – PPO | Admitting: Dermatology

## 2021-07-24 ENCOUNTER — Ambulatory Visit: Payer: BC Managed Care – PPO | Admitting: Dermatology

## 2021-07-24 ENCOUNTER — Encounter: Payer: Self-pay | Admitting: Dermatology

## 2021-07-24 ENCOUNTER — Other Ambulatory Visit: Payer: Self-pay

## 2021-07-24 DIAGNOSIS — L814 Other melanin hyperpigmentation: Secondary | ICD-10-CM | POA: Diagnosis not present

## 2021-07-24 DIAGNOSIS — L720 Epidermal cyst: Secondary | ICD-10-CM

## 2021-07-24 DIAGNOSIS — L309 Dermatitis, unspecified: Secondary | ICD-10-CM | POA: Diagnosis not present

## 2021-07-24 DIAGNOSIS — D229 Melanocytic nevi, unspecified: Secondary | ICD-10-CM

## 2021-07-24 DIAGNOSIS — D1800 Hemangioma unspecified site: Secondary | ICD-10-CM

## 2021-07-24 DIAGNOSIS — L578 Other skin changes due to chronic exposure to nonionizing radiation: Secondary | ICD-10-CM

## 2021-07-24 DIAGNOSIS — Z1283 Encounter for screening for malignant neoplasm of skin: Secondary | ICD-10-CM | POA: Diagnosis not present

## 2021-07-24 DIAGNOSIS — D2371 Other benign neoplasm of skin of right lower limb, including hip: Secondary | ICD-10-CM

## 2021-07-24 DIAGNOSIS — L821 Other seborrheic keratosis: Secondary | ICD-10-CM | POA: Diagnosis not present

## 2021-07-24 MED ORDER — TACROLIMUS 0.1 % EX OINT: TOPICAL_OINTMENT | Freq: Two times a day (BID) | CUTANEOUS | 2 refills | Status: DC

## 2021-07-24 NOTE — Progress Notes (Signed)
   New Patient Visit  Subjective  Kathy Holt is a 70 y.o. female who presents for the following: Annual Exam (New patient here for skin cancer screening and to establish care. No personal H/O skin cancer. C/O lesion on back to be checked. Raised, at bra area. ) and Rash (Breast areas, abdomen, groin areas. Thinks could be allergic reaction to elastic material in undergarments. Comes and goes. Has used Triamcinolone cream, no help. ).  Patient here for full body skin exam and skin cancer screening.   Objective  Well appearing patient in no apparent distress; mood and affect are within normal limits.  Review of Systems: No other skin or systemic complaints except as noted in HPI or Assessment and Plan.  A full examination was performed including scalp, head, eyes, ears, nose, lips, neck, chest, axillae, abdomen, back, buttocks, bilateral upper extremities, bilateral lower extremities, hands, feet, fingers, toes, fingernails, and toenails. All findings within normal limits unless otherwise noted below.  breast folds, abdomen Clear today.  Assessment & Plan  Dermatitis breast folds, abdomen  History of recurrent red itchy rash under areas of elastic clothing (bra, pants). Occurs approximately once per week and significantly bothersome. Present for > 1 year. Unclear if irritant vs allergic contact dermatitis  Discussed T.R.U.E. Test patch testing. If inconclusive can refer to [facility].  Plan patch testing at semester break.  Failed Triamcinolone 0.1% cream and not using too often due to risk of atrophy and lightening with frequently long-term use.  D/c triamcinolone.  Start Tacrolimus 0.1% oint BID PRN flares. Call if not working well.  tacrolimus (PROTOPIC) 0.1 % ointment - breast folds, abdomen Apply topically 2 (two) times daily.  Lentigines - Scattered tan macules - Due to sun exposure - Benign-appering, observe - Recommend daily broad spectrum sunscreen SPF  30+ to sun-exposed areas, reapply every 2 hours as needed. - Call for any changes  Seborrheic Keratoses - Stuck-on, waxy, tan-brown papules and/or plaques  - Benign-appearing - Discussed benign etiology and prognosis. - Observe - Call for any changes  Melanocytic Nevi - Tan-brown and/or pink-flesh-colored symmetric macules and papules - Benign appearing on exam today - Observation - Call clinic for new or changing moles - Recommend daily use of broad spectrum spf 30+ sunscreen to sun-exposed areas.   Hemangiomas - Red papules - Discussed benign nature - Observe - Call for any changes  Actinic Damage - Chronic condition, secondary to cumulative UV/sun exposure - diffuse scaly erythematous macules with underlying dyspigmentation - Recommend daily broad spectrum sunscreen SPF 30+ to sun-exposed areas, reapply every 2 hours as needed.  - Staying in the shade or wearing long sleeves, sun glasses (UVA+UVB protection) and wide brim hats (4-inch brim around the entire circumference of the hat) are also recommended for sun protection.  - Call for new or changing lesions.  Skin cancer screening performed today.   Milia on face - tiny firm white papules - type of cyst - benign - may be extracted if symptomatic - observe  Dermatofibroma at right thigh - Firm pink/brown papulenodule with dimple sign - Benign appearing - Call for any changes   Return for [facility]SE in 1 year; Patch testing when patient available. Loraine Maple, CMA, am acting as scribe for Forest Gleason, MD.  Documentation: I have reviewed the above documentation for accuracy and completeness, and I agree with the above.  Forest Gleason, MD

## 2021-07-24 NOTE — Patient Instructions (Addendum)
Melanoma ABCDEs  Melanoma is the most dangerous type of skin cancer, and is the leading cause of death from skin disease.  You are more likely to develop melanoma if you: Have light-colored skin, light-colored eyes, or red or blond hair Spend a lot of time in the sun Tan regularly, either outdoors or in a tanning bed Have had blistering sunburns, especially during childhood Have a close family member who has had a melanoma Have atypical moles or large birthmarks  Early detection of melanoma is key since treatment is typically straightforward and cure rates are extremely high if we catch it early.   The first sign of melanoma is often a change in a mole or a new dark spot.  The ABCDE system is a way of remembering the signs of melanoma.  A for asymmetry:  The two halves do not match. B for border:  The edges of the growth are irregular. C for color:  A mixture of colors are present instead of an even brown color. D for diameter:  Melanomas are usually (but not always) greater than 6mm - the size of a pencil eraser. E for evolution:  The spot keeps changing in size, shape, and color.  Please check your skin once per month between visits. You can use a small mirror in front and a large mirror behind you to keep an eye on the back side or your body.   If you see any new or changing lesions before your next follow-up, please call to schedule a visit.  Please continue daily skin protection including broad spectrum sunscreen SPF 30+ to sun-exposed areas, reapplying every 2 hours as needed when you're outdoors.   Staying in the shade or wearing long sleeves, sun glasses (UVA+UVB protection) and wide brim hats (4-inch brim around the entire circumference of the hat) are also recommended for sun protection.    If you have any questions or concerns for your doctor, please call our main line at [phone] and press option 4 to reach your doctor's medical assistant. If no one answers, please leave a  voicemail as directed and we will return your call as soon as possible. Messages left after 4 pm will be answered the following business day.   You may also send us a message via MyChart. We typically respond to MyChart messages within 1-2 business days.  For prescription refills, please ask your pharmacy to contact our office. Our fax number is [phone].  If you have an urgent issue when the clinic is closed that cannot wait until the next business day, you can page your doctor at the number below.    Please note that while we do our best to be available for urgent issues outside of office hours, we are not available 24/7.   If you have an urgent issue and are unable to reach us, you may choose to seek medical care at your doctor's office, retail clinic, urgent care center, or emergency room.  If you have a medical emergency, please immediately call 911 or go to the emergency department.  Pager Numbers  - Dr. Kowalski: [phone]  - Dr. Moye: [phone]  - Dr. Stewart: [phone]  In the event of inclement weather, please call our main line at [phone] for an update on the status of any delays or closures.  Dermatology Medication Tips: Please keep the boxes that topical medications come in in order to help keep track of the instructions about where and how to use these. Pharmacies typically   print the medication instructions only on the boxes and not directly on the medication tubes.   If your medication is too expensive, please contact our office at [phone] option 4 or send [place] a message through [place].   We are unable to tell what your co-pay for medications will be in advance as this is different depending on your insurance coverage. However, we may be able to find a substitute medication at lower cost or fill out paperwork to get insurance to cover a needed medication.   If a prior authorization is required to get your medication covered by your insurance  company, please allow [place] 1-2 business days to complete this process.  Drug prices often vary depending on where the prescription is filled and some pharmacies may offer cheaper prices.  The website www.goodrx.com contains coupons for medications through different pharmacies. The prices here do not account for what the cost may be with help from insurance (it may be cheaper with your insurance), but the website can give you the price if you did not use any insurance.  - You can print the associated coupon and take it with your prescription to the pharmacy.  - You may also stop by our office during regular business hours and pick up a GoodRx coupon card.  - If you need your prescription sent electronically to a different pharmacy, notify our office through [facility] or by phone at [phone] option 4.  Recommend taking Heliocare sun protection supplement daily in sunny weather for additional sun protection. For maximum protection on the sunniest days, you can take up to 2 capsules of regular Heliocare OR take 1 capsule of Heliocare Ultra. For prolonged exposure (such as a full day in the sun), you can repeat your dose of the supplement 4 hours after your first dose. Heliocare can be purchased at [facility] or at [email or web address].   Recommend daily broad spectrum sunscreen SPF 30+ to sun-exposed areas, reapply every 2 hours as needed. Call for new or changing lesions.  Staying in the shade or wearing long sleeves, sun glasses (UVA+UVB protection) and wide brim hats (4-inch brim around the entire circumference of the hat) are also recommended for sun protection.

## 2021-09-19 ENCOUNTER — Other Ambulatory Visit: Payer: Self-pay | Admitting: Sports Medicine

## 2021-09-19 DIAGNOSIS — G8929 Other chronic pain: Secondary | ICD-10-CM

## 2021-09-19 DIAGNOSIS — M25462 Effusion, left knee: Secondary | ICD-10-CM

## 2021-09-19 DIAGNOSIS — M7122 Synovial cyst of popliteal space [Baker], left knee: Secondary | ICD-10-CM

## 2021-09-19 DIAGNOSIS — W19XXXD Unspecified fall, subsequent encounter: Secondary | ICD-10-CM

## 2021-09-19 DIAGNOSIS — M25562 Pain in left knee: Secondary | ICD-10-CM

## 2021-09-19 DIAGNOSIS — M1712 Unilateral primary osteoarthritis, left knee: Secondary | ICD-10-CM

## 2021-10-02 ENCOUNTER — Ambulatory Visit
Admission: RE | Admit: 2021-10-02 | Discharge: 2021-10-02 | Disposition: A | Discharge status: Home / Self Care | Payer: BC Managed Care – PPO | Source: Ambulatory Visit | Attending: Sports Medicine | Admitting: Sports Medicine

## 2021-10-02 DIAGNOSIS — M1712 Unilateral primary osteoarthritis, left knee: Secondary | ICD-10-CM | POA: Diagnosis present

## 2021-10-02 DIAGNOSIS — M25462 Effusion, left knee: Secondary | ICD-10-CM | POA: Insufficient documentation

## 2021-10-02 DIAGNOSIS — M25562 Pain in left knee: Secondary | ICD-10-CM

## 2021-10-02 DIAGNOSIS — W19XXXD Unspecified fall, subsequent encounter: Secondary | ICD-10-CM | POA: Diagnosis not present

## 2021-10-02 DIAGNOSIS — G8929 Other chronic pain: Secondary | ICD-10-CM | POA: Insufficient documentation

## 2021-10-02 DIAGNOSIS — M7122 Synovial cyst of popliteal space [Baker], left knee: Secondary | ICD-10-CM | POA: Diagnosis not present

## 2021-10-02 IMAGING — MR MR KNEE*L* W/O CM
6 series · 40 of 40 positions shown · non-contrast
Comparison: MRI left knee dated [DATE].

CLINICAL DATA: Anterior left knee pain since fall in [REDACTED]. Prior
surgery in [REDACTED].

EXAM:
MRI OF THE LEFT KNEE WITHOUT CONTRAST
TECHNIQUE: Multiplanar, multisequence MR imaging of the knee was performed. No
intravenous contrast was administered.

[Series 8: T2 fat-sat · axial · left · 4.0mm · 0.50mm/px · z∈[-89,+36]mm · 5 of 26 slices shown (1 of 3)]
[im 1/26]
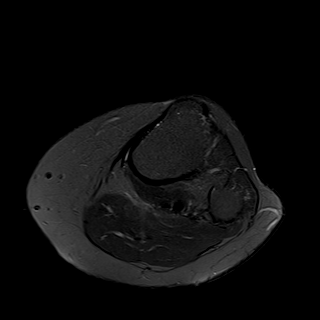
[im 7/26]
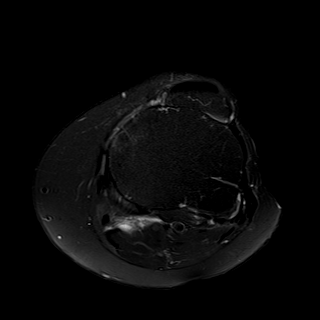
[im 13/26]
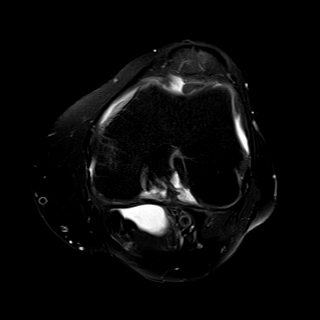
[im 19/26]
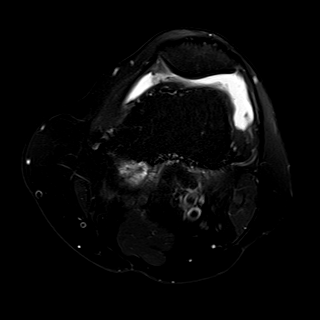
[im 26/26]
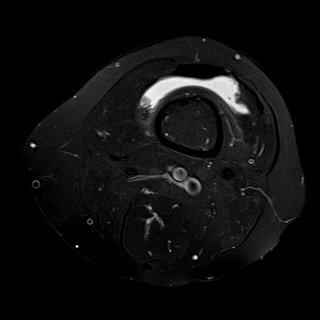

[Series 9: T2 fat-sat · coronal · left · 4.0mm · 0.59mm/px · 6 of 30 slices shown (2 of 3)]
[im 1/30]
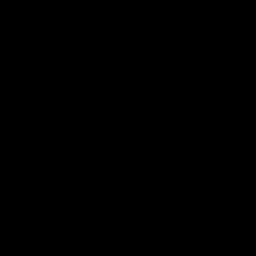
[im 6/30]
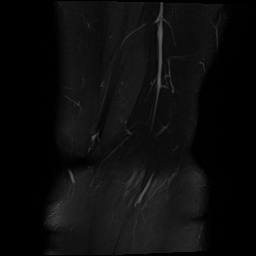
[im 12/30]
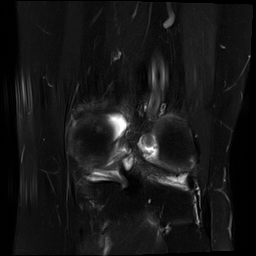
[im 18/30]
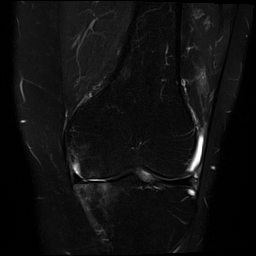
[im 24/30]
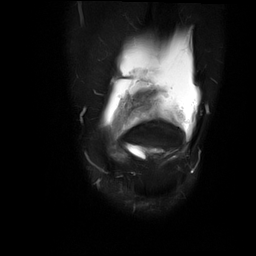
[im 30/30]
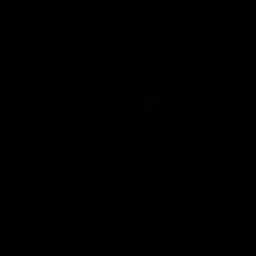

[Series 10: T1 · coronal · left · 4.0mm · 0.59mm/px · 7 of 30 slices shown]
[im 1/30]
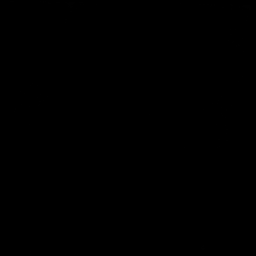
[im 5/30]
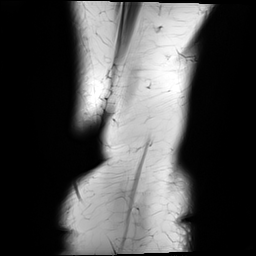
[im 10/30]
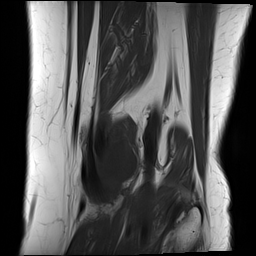
[im 15/30]
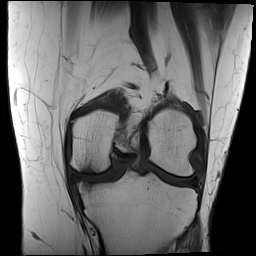
[im 20/30]
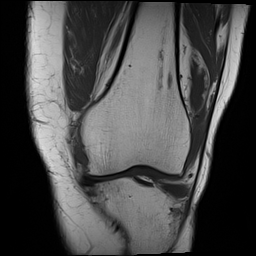
[im 25/30]
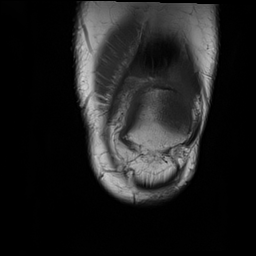
[im 30/30]
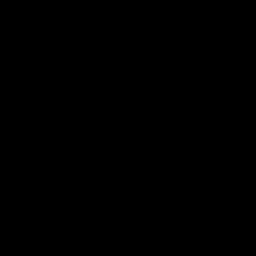

[Series 11: PD fat-sat · coronal · left · 4.0mm · 0.59mm/px · 7 of 30 slices shown (1 of 2)]
[im 1/30]
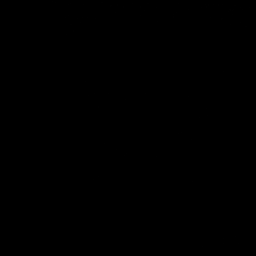
[im 5/30]
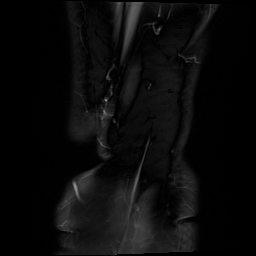
[im 10/30]
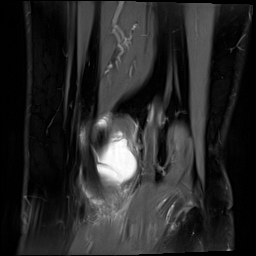
[im 15/30]
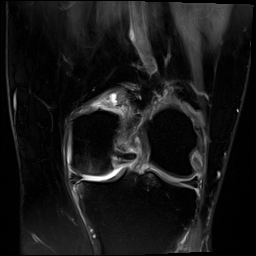
[im 20/30]
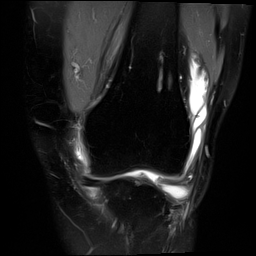
[im 25/30]
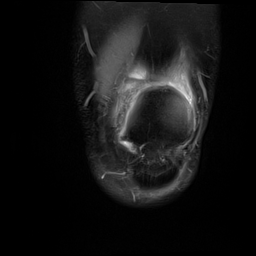
[im 30/30]
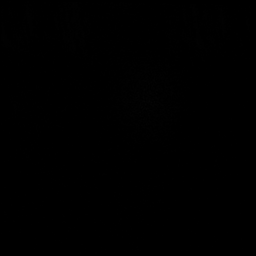

[Series 12: PD fat-sat · sagittal · left · 3.0mm · 0.59mm/px · 7 of 33 slices shown (2 of 2)]
[im 1/33]
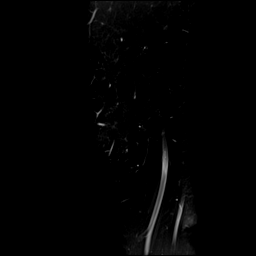
[im 6/33]
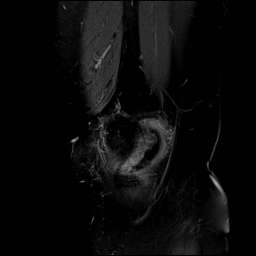
[im 11/33]
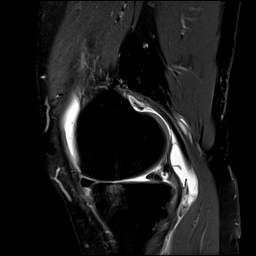
[im 17/33]
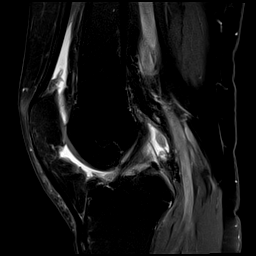
[im 22/33]
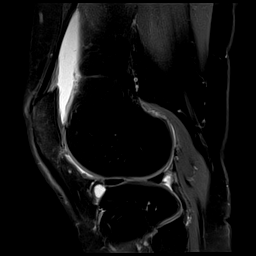
[im 27/33]
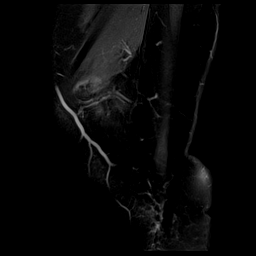
[im 33/33]
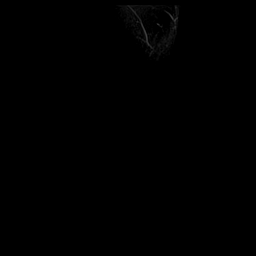

[Series 13: T2 fat-sat · sagittal · left · 3.0mm · 0.59mm/px · 8 of 35 slices shown (3 of 3)]
[im 1/35]
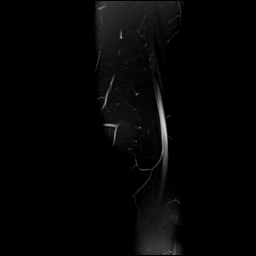
[im 5/35]
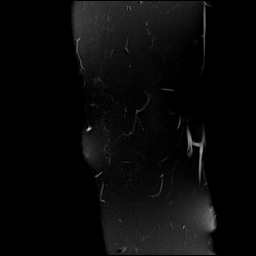
[im 10/35]
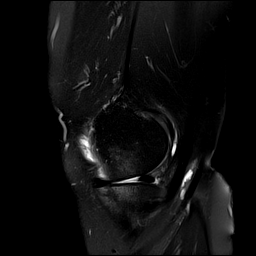
[im 15/35]
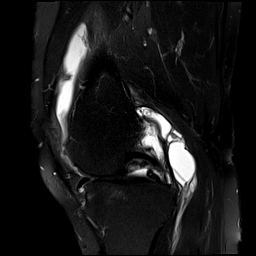
[im 20/35]
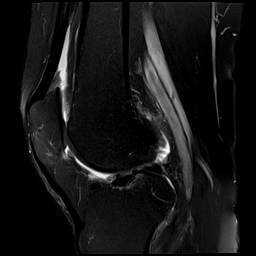
[im 25/35]
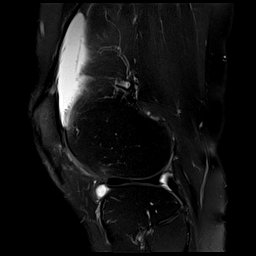
[im 30/35]
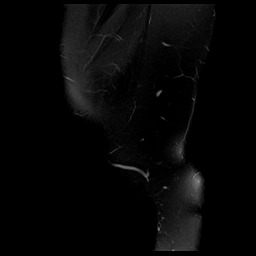
[im 35/35]
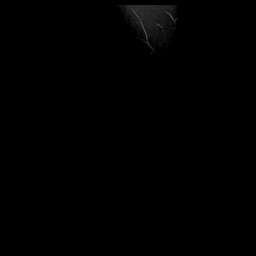

[40 of 40 positions shown; findings below may reference images not displayed]

FINDINGS: MENISCI

Medial meniscus: New mild blunting of the body and posterior horn,
consistent with interval partial meniscectomy. No recurrent tear.

Lateral meniscus:  Intact.

LIGAMENTS

Cruciates:  Intact ACL and PCL.

Collaterals: Medial collateral ligament is intact. Lateral
collateral ligament complex is intact.

CARTILAGE

Patellofemoral: Unchanged mild cartilage thinning without focal
defect.

Medial: Progressive now full-thickness cartilage loss over the
central weight-bearing medial femoral condyle and medial tibial
plateau with underlying subchondral marrow edema.

Lateral:  Unchanged mild cartilage thinning without focal defect.

Joint:  Increased small joint effusion.  Normal Hoffa's fat.

Popliteal Fossa: Slightly decreased complex Baker cyst currently
measuring 4.1 cm in length, previously 5.5 cm. Intact popliteus
tendon.

Extensor Mechanism: Intact quadriceps tendon and patellar tendon.
Intact medial and lateral patellar retinaculum. Intact MPFL.

Bones: No acute fracture or dislocation. No suspicious bone lesion.
Unchanged small tricompartmental marginal osteophytes.

Other: None.
IMPRESSION: 1. New mild blunting of the body and posterior horn, consistent with
interval partial meniscectomy. No recurrent tear.
2. Progressive now moderate medial compartment osteoarthritis.
3. Increased small joint effusion. Slightly decreased complex Baker
cyst.

## 2021-11-18 ENCOUNTER — Ambulatory Visit: Payer: BC Managed Care – PPO

## 2021-11-20 ENCOUNTER — Ambulatory Visit: Payer: BC Managed Care – PPO

## 2022-04-29 ENCOUNTER — Other Ambulatory Visit: Payer: Self-pay | Admitting: Internal Medicine

## 2022-04-29 DIAGNOSIS — Z1231 Encounter for screening mammogram for malignant neoplasm of breast: Secondary | ICD-10-CM

## 2022-07-30 ENCOUNTER — Encounter: Payer: BC Managed Care – PPO | Admitting: Dermatology

## 2022-09-18 ENCOUNTER — Encounter: Payer: BC Managed Care – PPO | Admitting: Dermatology

## 2022-10-06 ENCOUNTER — Encounter: Payer: Self-pay | Admitting: Nurse Practitioner

## 2022-10-06 ENCOUNTER — Ambulatory Visit: Payer: BC Managed Care – PPO | Admitting: Nurse Practitioner

## 2022-10-06 VITALS — BP 156/84 | HR 98 | Temp 98.3°F | Resp 16 | Ht 63.75 in | Wt 146.4 lb

## 2022-10-06 DIAGNOSIS — Z7689 Persons encountering health services in other specified circumstances: Secondary | ICD-10-CM | POA: Diagnosis not present

## 2022-10-06 DIAGNOSIS — I4891 Unspecified atrial fibrillation: Secondary | ICD-10-CM | POA: Diagnosis not present

## 2022-10-06 DIAGNOSIS — Z1159 Encounter for screening for other viral diseases: Secondary | ICD-10-CM | POA: Diagnosis not present

## 2022-10-06 DIAGNOSIS — M810 Age-related osteoporosis without current pathological fracture: Secondary | ICD-10-CM

## 2022-10-06 DIAGNOSIS — M1990 Unspecified osteoarthritis, unspecified site: Secondary | ICD-10-CM

## 2022-10-06 DIAGNOSIS — Z1211 Encounter for screening for malignant neoplasm of colon: Secondary | ICD-10-CM | POA: Diagnosis not present

## 2022-10-06 DIAGNOSIS — R1900 Intra-abdominal and pelvic swelling, mass and lump, unspecified site: Secondary | ICD-10-CM | POA: Insufficient documentation

## 2022-10-06 DIAGNOSIS — F439 Reaction to severe stress, unspecified: Secondary | ICD-10-CM | POA: Insufficient documentation

## 2022-10-06 DIAGNOSIS — R030 Elevated blood-pressure reading, without diagnosis of hypertension: Secondary | ICD-10-CM

## 2022-10-06 DIAGNOSIS — Z131 Encounter for screening for diabetes mellitus: Secondary | ICD-10-CM

## 2022-10-06 DIAGNOSIS — F419 Anxiety disorder, unspecified: Secondary | ICD-10-CM | POA: Insufficient documentation

## 2022-10-06 DIAGNOSIS — F5105 Insomnia due to other mental disorder: Secondary | ICD-10-CM

## 2022-10-06 DIAGNOSIS — M5489 Other dorsalgia: Secondary | ICD-10-CM

## 2022-10-06 DIAGNOSIS — Z1231 Encounter for screening mammogram for malignant neoplasm of breast: Secondary | ICD-10-CM

## 2022-10-06 DIAGNOSIS — Z13220 Encounter for screening for lipoid disorders: Secondary | ICD-10-CM

## 2022-10-06 DIAGNOSIS — F99 Mental disorder, not otherwise specified: Secondary | ICD-10-CM

## 2022-10-06 DIAGNOSIS — D259 Leiomyoma of uterus, unspecified: Secondary | ICD-10-CM | POA: Insufficient documentation

## 2022-10-06 MED ORDER — TRAZODONE HCL 50 MG PO TABS: 25.0000 mg | ORAL_TABLET | Freq: Every day | ORAL | 3 refills | Status: DC

## 2022-10-06 MED ORDER — BUSPIRONE HCL 5 MG PO TABS: 5.0000 mg | ORAL_TABLET | Freq: Three times a day (TID) | ORAL | 0 refills | Status: DC | PRN

## 2022-10-06 NOTE — Assessment & Plan Note (Signed)
Patient reports that she has had increased stress with taking care of her husband.  She she also suffers from insomnia.  Patient states she has been taking trazodone 25 mg at night.  She occasionally takes a Valium 2 mg.  He says she only takes it at night sometimes.  Discussed trying BuSpar during the day.  Patient is agreement with plan.  Prescription sent.

## 2022-10-06 NOTE — Assessment & Plan Note (Signed)
Patient currently takes Xarelto 20 mg daily.  Patient is not established with cardiology.  Referral placed to cardiology.  Patient is currently in atrial fibrillation.

## 2022-10-06 NOTE — Progress Notes (Signed)
BP (!) 156/84   Pulse 98   Temp 98.3 F (36.8 C) (Oral)   Resp 16   Ht 5' 3.75" (1.619 m)   Wt 146 lb 6.4 oz (66.4 kg)   LMP  (LMP Unknown)   SpO2 98%   BMI 25.33 kg/m    Subjective:    Patient ID: Kathy Holt, female    DOB: 08/28/1951, 71 y.o.   MRN: [id number]  HPI: Kathy Holt is a 71 y.o. female  Chief Complaint  Patient presents with   Establish Care   Stress    Pt has been under a lot of stress due to her husband, pt prefers to discuss with provider.   Results    Pt would also like to discuss previous Bone density results.   Establish care: Her last physical was a year ago. Her last colorectal cancer screening was 10/16/2019.  Her last mammogram was 12/25/2020. She had a hysterectomy.   Anxiety/stress/insomnia: She says that her stress has been increased recently.  She says that she has been caring for her husband who had esophageal cancer. He has to balloon dilation every two weeks. He then had an esophagectomy and had his vocal cords were damaged.  He is having trouble eating.  She says she has been taking trazodone 25 mg at night to help her sleep.   She says she will occasionally take valium at night.  Patient states she tries not to take her Valium.  Discussed trying BuSpar during the day.  She is agreement with plan.    10/06/2022    2:04 PM 10/06/2022   12:56 PM  Depression screen PHQ 2/9  Decreased Interest 0 0  Down, Depressed, Hopeless 0 0  PHQ - 2 Score 0 0  Altered sleeping 2 0  Tired, decreased energy 1 0  Change in appetite 0 0  Feeling bad or failure about yourself  0 0  Trouble concentrating 0 0  Moving slowly or fidgety/restless 0 0  Suicidal thoughts 0 0  PHQ-9 Score 3 0  Difficult doing work/chores Not difficult at all Not difficult at all       10/06/2022    2:04 PM  GAD 7 : Generalized Anxiety Score  Nervous, Anxious, on Edge 1  Control/stop worrying 1  Worry too much - different things 1  Trouble relaxing 1   Restless 0  Easily annoyed or irritable 0  Afraid - awful might happen 1  Total GAD 7 Score 5  Anxiety Difficulty Not difficult at all    Atrial fibrillation: She was diagnosed in 2019.  She says she was getting ready to have a colonoscopy and they saw that she had a-fib. She is currently taking xarelto 20 mg daily.  She says that it comes and goes.  She says recently she has noticed that it has been reoccurring.  She says she was previously was on metoprolol but she says she did not like how she felt on it. She says she is not establish with cardiology.  Referral placed to cardiology.  Arthritis/Low back pain/DDD: She says she did get injections in her back.   She says that she has not had to see anyone for her back in awhile.  She says she was seen at [facility]. Dr. Candelaria Stagers sports medicine. She tries to not aggravate her back.   Osteoporosis: She says that she was taking fosamax, but she was not feeling well when she took it.  She says her  previous provider referred her to endocrinology but she was never seen.  She is currently taking calcium, and vitamin d 5000u daily.  Bone density done two years ago.  Will order new bone density if no improvement will send to endocrinology for options.  Bone density showed: Lumbar spine -3.4, hip -2.4, Frax 2.8 10-year risk of hip fracture  Elevated blood pressure reading: Patient's blood pressure is 156/84.  Patient states she thinks her blood pressure is up due to stress.  We will trial BuSpar to help with stress.  Recheck blood pressure at next appointment.  Discussed with patient if blood pressure still elevated will likely need to start blood pressure medication.  Patient verbalized understanding.  Relevant past medical, surgical, family and social history reviewed and updated as indicated. Interim medical history since our last visit reviewed. Allergies and medications reviewed and updated.  Review of Systems  Constitutional: Negative for  fever or weight change.  Respiratory: Negative for cough and shortness of breath.   Cardiovascular: Negative for chest pain or palpitations.  Gastrointestinal: Negative for abdominal pain, no bowel changes.  Musculoskeletal: Negative for gait problem or joint swelling.  Skin: Negative for rash.  Neurological: Negative for dizziness or headache.  No other specific complaints in a complete review of systems (except as listed in HPI above).      Objective:    BP (!) 156/84   Pulse 98   Temp 98.3 F (36.8 C) (Oral)   Resp 16   Ht 5' 3.75" (1.619 m)   Wt 146 lb 6.4 oz (66.4 kg)   LMP  (LMP Unknown)   SpO2 98%   BMI 25.33 kg/m   Wt Readings from Last 3 Encounters:  10/06/22 146 lb 6.4 oz (66.4 kg)  01/24/21 150 lb (68 kg)    Physical Exam  Constitutional: Patient appears well-developed and well-nourished.  No distress.  HEENT: head atraumatic, normocephalic, pupils equal and reactive to light,  neck supple Cardiovascular: Normal rate, irregular rhythm and normal heart sounds.  No murmur heard. No BLE edema. Pulmonary/Chest: Effort normal and breath sounds normal. No respiratory distress. Abdominal: Soft.  There is no tenderness. Psychiatric: Patient has a normal mood and affect. behavior is normal. Judgment and thought content normal.     Assessment & Plan:   Problem List Items Addressed This Visit       Cardiovascular and Mediastinum   Atrial fibrillation ([place]) - Primary    Patient currently takes Xarelto 20 mg daily.  Patient is not established with cardiology.  Referral placed to cardiology.  Patient is currently in atrial fibrillation.        Relevant Orders   Ambulatory referral to Cardiology     Musculoskeletal and Integument   Arthritis    Patient reports that she was getting injections in her back.  She was being seen by Dr. Candelaria Stagers sports medicine and [place] [facility].  Patient states that her back has not really been giving her issues lately.  Patient states she  tries hard not to do activities aggravate it.      Age-related osteoporosis without current pathological fracture    Patient did have a DEXA scan a couple years ago.  Which showed osteoporosis.  Patient is taking calcium and vitamin D supplementation.  Patient states she did try Fosamax but it made her not feel well.  Her previous provider did refer her to endocrinology for other options.  However patient states she was never able to get into the office.  Discussed with patient that we would get another bone density scan and if appropriate will send to endocrinology for her options.      Relevant Orders   DG Bone Density     Other   Anxiety    Patient reports that she has had increased stress with taking care of her husband.  She she also suffers from insomnia.  Patient states she has been taking trazodone 25 mg at night.  She occasionally takes a Valium 2 mg.  He says she only takes it at night sometimes.  Discussed trying BuSpar during the day.  Patient is agreement with plan.  Prescription sent.      Relevant Medications   diazepam (VALIUM) 2 MG tablet   traZODone (DESYREL) 50 MG tablet   busPIRone (BUSPAR) 5 MG tablet   Back pain without sciatica    Patient reports that she was getting injections in her back.  She was being seen by Dr. Candelaria Stagers sports medicine and [place] [facility].  Patient states that her back has not really been giving her issues lately.  Patient states she tries hard not to do activities aggravate it.      Insomnia    Patient reports that she has had increased stress with taking care of her husband.  She she also suffers from insomnia.  Patient states she has been taking trazodone 25 mg at night.  She occasionally takes a Valium 2 mg.  He says she only takes it at night sometimes.  Discussed trying BuSpar during the day.  Patient is agreement with plan.  Prescription sent.      Relevant Medications   traZODone (DESYREL) 50 MG tablet   Stress    Patient reports  that she has had increased stress with taking care of her husband.  She she also suffers from insomnia.  Patient states she has been taking trazodone 25 mg at night.  She occasionally takes a Valium 2 mg.  He says she only takes it at night sometimes.  Discussed trying BuSpar during the day.  Patient is agreement with plan.  Prescription sent.      Relevant Medications   traZODone (DESYREL) 50 MG tablet   busPIRone (BUSPAR) 5 MG tablet   Other Visit Diagnoses     Need for hepatitis C screening test       Relevant Orders   Hepatitis C antibody   Screening for colon cancer       Relevant Orders   Cologuard   Encounter to establish care       Schedule CPE when due   Encounter for screening mammogram for malignant neoplasm of breast       Relevant Orders   MM 3D SCREEN BREAST BILATERAL   Elevated blood pressure reading       Patient reports blood pressure elevated likely due to stress.  We will treat stress if no improvement will start blood pressure medication.   Relevant Orders   CBC with Differential/Platelet   COMPLETE METABOLIC PANEL WITH GFR   Screening for cholesterol level       Relevant Orders   Lipid panel   Screening for diabetes mellitus       Relevant Orders   Hemoglobin A1c        Follow up plan: Return in about 3 months (around 01/06/2023) for follow up.

## 2022-10-06 NOTE — Assessment & Plan Note (Signed)
Patient reports that she was getting injections in her back.  She was being seen by Dr. Candelaria Stagers sports medicine and [place] [facility].  Patient states that her back has not really been giving her issues lately.  Patient states she tries hard not to do activities aggravate it.

## 2022-10-06 NOTE — Assessment & Plan Note (Signed)
Patient reports that she was getting injections in her back.  She was being seen by Dr. Candelaria Stagers sports medicine and [place] [facility].  Patient states that her back has not really been giving her issues lately.  Patient states she tries hard not to do activities aggravate it.

## 2022-10-06 NOTE — Assessment & Plan Note (Signed)
Patient did have a DEXA scan a couple years ago.  Which showed osteoporosis.  Patient is taking calcium and vitamin D supplementation.  Patient states she did try Fosamax but it made her not feel well.  Her previous provider did refer her to endocrinology for other options.  However patient states she was never able to get into the office.  Discussed with patient that we would get another bone density scan and if appropriate will send to endocrinology for her options.

## 2022-10-07 ENCOUNTER — Encounter: Payer: Self-pay | Admitting: Nurse Practitioner

## 2022-10-07 LAB — CBC WITH DIFFERENTIAL/PLATELET
Absolute Monocytes: 334 cells/uL (ref 200–950)
Basophils Absolute: 38 cells/uL (ref 0–200)
Basophils Relative: 0.5 %
Eosinophils Absolute: 84 cells/uL (ref 15–500)
Eosinophils Relative: 1.1 %
HCT: 44.3 % (ref 35.0–45.0)
Hemoglobin: 15 g/dL (ref 11.7–15.5)
Lymphs Abs: 1976 cells/uL (ref 850–3900)
MCH: 31.8 pg (ref 27.0–33.0)
MCHC: 33.9 g/dL (ref 32.0–36.0)
MCV: 93.9 fL (ref 80.0–100.0)
MPV: 13.4 fL — ABNORMAL HIGH (ref 7.5–12.5)
Monocytes Relative: 4.4 %
Neutro Abs: 5168 cells/uL (ref 1500–7800)
Neutrophils Relative %: 68 %
Platelets: 213 10*3/uL (ref 140–400)
RBC: 4.72 10*6/uL (ref 3.80–5.10)
RDW: 11.8 % (ref 11.0–15.0)
Total Lymphocyte: 26 %
WBC: 7.6 10*3/uL (ref 3.8–10.8)

## 2022-10-07 LAB — LIPID PANEL
Cholesterol: 188 mg/dL (ref <200)
HDL: 82 mg/dL (ref >=50)
LDL Cholesterol (Calc): 90 mg/dL (calc)
Non-HDL Cholesterol (Calc): 106 mg/dL (calc) (ref <130)
Total CHOL/HDL Ratio: 2.3 (calc) (ref <5.0)
Triglycerides: 71 mg/dL (ref <150)

## 2022-10-07 LAB — COMPLETE METABOLIC PANEL WITH GFR
AG Ratio: 1.7 (calc) (ref 1.0–2.5)
ALT: 16 U/L (ref 6–29)
AST: 17 U/L (ref 10–35)
Albumin: 4.4 g/dL (ref 3.6–5.1)
Alkaline phosphatase (APISO): 67 U/L (ref 37–153)
BUN: 15 mg/dL (ref 7–25)
CO2: 26 mmol/L (ref 20–32)
Calcium: 10.6 mg/dL — ABNORMAL HIGH (ref 8.6–10.4)
Chloride: 105 mmol/L (ref 98–110)
Creat: 0.73 mg/dL (ref 0.60–1.00)
Globulin: 2.6 g/dL (calc) (ref 1.9–3.7)
Glucose, Bld: 86 mg/dL (ref 65–99)
Potassium: 4.8 mmol/L (ref 3.5–5.3)
Sodium: 141 mmol/L (ref 135–146)
Total Bilirubin: 0.8 mg/dL (ref 0.2–1.2)
Total Protein: 7 g/dL (ref 6.1–8.1)
eGFR: 88 mL/min/{1.73_m2} (ref >=60)

## 2022-10-07 LAB — HEMOGLOBIN A1C
Hgb A1c MFr Bld: 5.6 % of total Hgb (ref <5.7)
Mean Plasma Glucose: 114 mg/dL
eAG (mmol/L): 6.3 mmol/L

## 2022-10-07 LAB — HEPATITIS C ANTIBODY: Hepatitis C Ab: NONREACTIVE

## 2022-10-09 ENCOUNTER — Ambulatory Visit (INDEPENDENT_AMBULATORY_CARE_PROVIDER_SITE_OTHER): Payer: BC Managed Care – PPO

## 2022-10-09 ENCOUNTER — Encounter: Payer: Self-pay | Admitting: Cardiology

## 2022-10-09 ENCOUNTER — Ambulatory Visit: Payer: BC Managed Care – PPO | Attending: Cardiology | Admitting: Cardiology

## 2022-10-09 ENCOUNTER — Ambulatory Visit (INDEPENDENT_AMBULATORY_CARE_PROVIDER_SITE_OTHER): Payer: BC Managed Care – PPO | Admitting: Dermatology

## 2022-10-09 VITALS — BP 138/86 | HR 73 | Ht <= 58 in | Wt 145.0 lb

## 2022-10-09 DIAGNOSIS — D229 Melanocytic nevi, unspecified: Secondary | ICD-10-CM

## 2022-10-09 DIAGNOSIS — L821 Other seborrheic keratosis: Secondary | ICD-10-CM | POA: Diagnosis not present

## 2022-10-09 DIAGNOSIS — D2371 Other benign neoplasm of skin of right lower limb, including hip: Secondary | ICD-10-CM

## 2022-10-09 DIAGNOSIS — Z1283 Encounter for screening for malignant neoplasm of skin: Secondary | ICD-10-CM | POA: Diagnosis not present

## 2022-10-09 DIAGNOSIS — L309 Dermatitis, unspecified: Secondary | ICD-10-CM | POA: Diagnosis not present

## 2022-10-09 DIAGNOSIS — L578 Other skin changes due to chronic exposure to nonionizing radiation: Secondary | ICD-10-CM

## 2022-10-09 DIAGNOSIS — I480 Paroxysmal atrial fibrillation: Secondary | ICD-10-CM

## 2022-10-09 DIAGNOSIS — L814 Other melanin hyperpigmentation: Secondary | ICD-10-CM

## 2022-10-09 DIAGNOSIS — D235 Other benign neoplasm of skin of trunk: Secondary | ICD-10-CM

## 2022-10-09 DIAGNOSIS — D2372 Other benign neoplasm of skin of left lower limb, including hip: Secondary | ICD-10-CM

## 2022-10-09 NOTE — Progress Notes (Signed)
Cardiology Office Note:    Date:  10/09/2022   ID:  Kathy Holt, DOB 09-30-1951, MRN [id number]  PCP:  Bo Merino, [place] Providers Cardiologist:  None     Referring MD: Bo Merino, FNP   Chief Complaint  Patient presents with   New Patient (Initial Visit)    Afib Hx, home related stress causing chest pain    Kathy Holt is a 71 y.o. female who is being seen today for the evaluation of atrial fibrillation at the request of Bo Merino, FNP.   History of Present Illness:    Kathy Holt is a 71 y.o. female with a hx of paroxysmal atrial fibrillation, anxiety who presents to establish care.  Was diagnosed with atrial fibrillation in 2019 while getting ready to have a colonoscopy.  Takes Xarelto for stroke prophylaxis.  Was previously on metoprolol, but did not like how it made her feel.  She states knowing when she goes into atrial fibrillation, sometimes feels her heart beating irregularly, and also has some chest tightness when in A-fib.  Otherwise doing okay, states being under a lot of stress over the past 4 months with husband diagnosed with esophageal cancer, has required frequent endoscopies and esophageal dilatations.  Outside stress echo 02/2018 showed normal EF  Past Medical History:  Diagnosis Date   Arthritis    lower back   DDD (degenerative disc disease), cervical    Dysrhythmia    Afib    Past Surgical History:  Procedure Laterality Date   ABDOMINAL HYSTERECTOMY     partial   BLADDER SUSPENSION  1998   COLONOSCOPY     KNEE ARTHROSCOPY WITH MEDIAL MENISECTOMY Left 01/30/2021   Procedure: KNEE ARTHROSCOPY WITH PARTIAL MEDIAL/LATERAL MENISECTOMY, PARTIAL SYNOVECTOMY;  Surgeon: Lovell Sheehan, MD;  Location: [facility] ORS;  Service: Orthopedics;  Laterality: Left;    Current Medications: Current Meds  Medication Sig   calcium acetate, Phos Binder, (PHOSLYRA) 667 MG/5ML SOLN Take by mouth 3 (three) times  daily with meals.   calcium carbonate (OSCAL) 1500 (600 Ca) MG TABS tablet Take 600 mg of elemental calcium by mouth 2 (two) times daily with a meal.   Cholecalciferol 50 MCG (2000 UT) TABS Take 2,000 Units by mouth daily.   diazepam (VALIUM) 2 MG tablet Take 2 mg by mouth 2 (two) times daily.   Magnesium 200 MG TABS Take 200 mg by mouth at bedtime.   MAGNESIUM GLYCINATE PO Take 400 mg by mouth at bedtime.   Multiple Vitamin (MULTI-VITAMIN DAILY PO) Take 1 tablet by mouth daily.   OVER THE COUNTER MEDICATION Take 1 tablet by mouth daily. Calcium Algae   rivaroxaban (XARELTO) 20 MG TABS tablet Take 20 mg by mouth daily with supper.   traZODone (DESYREL) 50 MG tablet Take 0.5 tablets (25 mg total) by mouth at bedtime.     Allergies:   Latex   Social History   Socioeconomic History   Marital status: Married    Spouse name: andrew   Number of children: Not on file   Years of education: Not on file   Highest education level: Not on file  Occupational History   Occupation: run Technical sales engineer. at [employer]    Comment: full time  Tobacco Use   Smoking status: Never   Smokeless tobacco: Never  Vaping Use   Vaping Use: Never used  Substance and Sexual Activity   Alcohol use: Never    Comment:  not in 20 years   Drug use: Never   Sexual activity: Not Currently  Other Topics Concern   Not on file  Social History Narrative   Patient lives with husband and feels safe in her home.  She still works full time.   Social Determinants of Health   Financial Resource Strain: Low Risk  (10/06/2022)   Overall Financial Resource Strain (CARDIA)    Difficulty of Paying Living Expenses: Not hard at all  Food Insecurity: No Food Insecurity (10/06/2022)   Hunger Vital Sign    Worried About Running Out of Food in the Last Year: Never true    Ran Out of Food in the Last Year: Never true  Transportation Needs: No Transportation Needs (10/06/2022)   PRAPARE - Radiographer, therapeutic (Medical): No    Lack of Transportation (Non-Medical): No  Physical Activity: Sufficiently Active (10/06/2022)   Exercise Vital Sign    Days of Exercise per Week: 5 days    Minutes of Exercise per Session: 30 min  Stress: Stress Concern Present (10/06/2022)   [place]    Feeling of Stress : Very much  Social Connections: Moderately Isolated (10/06/2022)   Social Connection and Isolation Panel [NHANES]    Frequency of Communication with Friends and Family: Three times a week    Frequency of Social Gatherings with Friends and Family: Three times a week    Attends Religious Services: Never    Active Member of Clubs or Organizations: No    Attends Archivist Meetings: Never    Marital Status: Married     Family History: [name]; [name]; [name].  ROS:   Please see the history of present illness.     All other systems reviewed and are negative.  EKGs/Labs/Other Studies Reviewed:    The following studies were reviewed today:   EKG:  EKG is  ordered today.  The ekg ordered today demonstrates atrial fibrillation, heart rate 73  Recent Labs: 10/06/2022: ALT 16; BUN 15; Creat 0.73; Hemoglobin 15.0; Platelets 213; Potassium 4.8; Sodium 141  Recent Lipid Panel    Component Value Date/Time   CHOL 188 10/06/2022 1417   TRIG 71 10/06/2022 1417   HDL 82 10/06/2022 1417   CHOLHDL 2.3 10/06/2022 1417   LDLCALC 90 10/06/2022 1417     Risk Assessment/Calculations:             Physical Exam:    VS:  BP 138/86 (BP Location: Right Arm, Patient Position: Sitting, Cuff Size: Normal)   Pulse 73   Ht (!) 5.38" (0.137 m)   Wt 145 lb (65.8 kg)   LMP  (LMP Unknown)   SpO2 100%   BMI 3528.69 kg/m     Wt Readings from Last 3 Encounters:  10/09/22 145 lb (65.8 kg)  10/06/22 146 lb 6.4 oz (66.4  kg)  01/24/21 150 lb (68 kg)     GEN:  Well nourished, well developed in no acute distress HEENT: Normal NECK: No JVD; No carotid bruits CARDIAC: RRR, no murmurs, rubs, gallops RESPIRATORY:  Clear to auscultation without rales, wheezing or rhonchi  ABDOMEN: Soft, non-tender, non-distended MUSCULOSKELETAL:  No edema; No deformity  SKIN: Warm and dry NEUROLOGIC:  Alert and oriented x 3 PSYCHIATRIC:  Normal affect   ASSESSMENT:    1. Paroxysmal atrial fibrillation (HCC)  PLAN:    In order of problems listed above:  Paroxysmal atrial fibrillation, usually fatigue with moderate symptoms when in A-fib.  EKG today showing atrial fibrillation.  Place cardiac monitor to document burden, obtain echocardiogram.  Heart rate controlled, continue Xarelto 20 mg daily. Will Consider DC cardioversion if A-fib becomes persistent.  May consider Cardizem if she becomes tachycardic as she did not tolerate beta-blocker in the past. Refer to EP/A-fib clinic.       Follow-up in 6 weeks after echo  Medication Adjustments/Labs and Tests Ordered: Current medicines are reviewed at length with the patient today.  Concerns regarding medicines are outlined above.  Orders Placed This Encounter  Procedures   Ambulatory referral to Cardiac Electrophysiology   LONG TERM MONITOR (3-14 DAYS)   ECHOCARDIOGRAM COMPLETE   No orders of the defined types were placed in this encounter.   Patient Instructions  Medication Instructions:   Your physician recommends that you continue on your current medications as directed. Please refer to the Current Medication list given to you today.   *If you need a refill on your cardiac medications before your next appointment, please call your pharmacy*   Testing/Procedures:  Your physician has requested that you have an echocardiogram in 3 weeks. Echocardiography is a painless test that uses sound waves to create images of your heart. It provides your doctor with  information about the size and shape of your heart and how well your heart's chambers and valves are working. This procedure takes approximately one hour. There are no restrictions for this procedure. Please do NOT wear cologne, perfume, aftershave, or lotions (deodorant is allowed). Please arrive 15 minutes prior to your appointment time.    2.   Your physician has recommended that you wear a Zio XT monitor for 2 weeks. This will be mailed to your home address in 4-5 business days.   Your clinician has requested a Zio heart rhythm monitor by [employer] to be mailed to your home for you to wear for 14 days. You should expect a small box to arrive via USPS (or FedEx in some cases) within this next week. If you do not receive it please call [employer] at [phone].  Closely watching your heart at this time will help your care team understand more and provide information needed to develop your plan of care.  Please apply your Zio patch monitor the day you receive it. Keep this packaging, you will use this to return your Zio monitor.  You will easily be able to apply the monitor with the instructions provided in the Patient Guide.  If you need assistance, [employer] representatives are available 24/7 at [phone].  You can also download the [facility] app on your phone to view detailed application instructions and log symptoms.  After you wear your monitor for 14 days, place it back in the blue box or envelope, along with your Symptom Log.  To send your monitor back: Simply use the pre-addressed and pre-paid box/envelope.  Send it back through [employer] the same day you remove it via your local post office or by placing it in your mailbox.  As soon as we receive the results, they will be reviewed and your clinician will contact you.  For the first 24 hours- it is essential to not shower or exercise, to allow the patch to adhere to your skin. Avoid excessive sweating to help maximize wear  time. Do not submerge the device, no hot tubs, and no swimming pools. Keep any  lotions or oils away from the patch. After 24 hours you may shower with the patch on. Take brief showers with your back facing the shower head.  Do not remove patch once it has been placed because that will interrupt data and decrease adhesive wear time. Push the button when you have any symptoms and write down what you were feeling. Once you have completed wearing your monitor, remove and place into box which has postage paid and place in your outgoing mailbox.  If for some reason you have misplaced your box then call our office and we can provide another box and/or mail it off for you.    Follow-Up: At [facility], you and your health needs are our priority.  As part of our continuing mission to provide you with exceptional heart care, we have created designated Provider Care Teams.  These Care Teams include your primary Cardiologist (physician) and Advanced Practice Providers (APPs -  Physician Assistants and Nurse Practitioners) who all work together to provide you with the care you need, when you need it.  We recommend signing up for the patient portal called "MyChart".  Sign up information is provided on this After Visit Summary.  MyChart is used to connect with patients for Virtual Visits (Telemedicine).  Patients are able to view lab/test results, encounter notes, upcoming appointments, etc.  Non-urgent messages can be sent to your provider as well.   To learn more about what you can do with MyChart, go to [email or web address].    Your next appointment:   6-8 week(s)  The format for your next appointment:   In Person  Provider:   Kate Sable, MD    Other Instructions   Important Information About Sugar         Signed, Kate Sable, MD  10/09/2022 12:42 PM    [place]

## 2022-10-09 NOTE — Progress Notes (Signed)
   Follow-Up Visit   Subjective  Kathy Holt is a 71 y.o. female who presents for the following: Annual Exam (No hx of skin cancer or dysplastic nevi, hx of dermatitis, doing well, has Protopic to use PRN rash).  The following portions of the chart were reviewed this encounter and updated as appropriate:   Tobacco  Allergies  Meds  Problems  Med Hx  Surg Hx  Fam Hx      Review of Systems:  No other skin or systemic complaints except as noted in HPI or Assessment and Plan.  Objective  Well appearing patient in no apparent distress; mood and affect are within normal limits.  A full examination was performed including scalp, head, eyes, ears, nose, lips, neck, chest, axillae, abdomen, back, buttocks, bilateral upper extremities, bilateral lower extremities, hands, feet, fingers, toes, fingernails, and toenails. All findings within normal limits unless otherwise noted below.    Assessment & Plan  Dermatitis Left Thigh - Anterior  irritant vs allergic contact dermatitis  Chronic condition with duration over one year. Currently well-controlled.  Continue tacrolimus ointment twice a day as needed    Related Medications tacrolimus (PROTOPIC) 0.1 % ointment Apply topically 2 (two) times daily.   Lentigines - Scattered tan macules - Due to sun exposure - Benign-appearing, observe - Recommend daily broad spectrum sunscreen SPF 30+ to sun-exposed areas, reapply every 2 hours as needed. - Call for any changes  Seborrheic Keratoses - Stuck-on, waxy, tan-brown papules and/or plaques  - Benign-appearing - Discussed benign etiology and prognosis. - Observe - Call for any changes  Melanocytic Nevi - Tan-brown and/or pink-flesh-colored symmetric macules and papules - Benign appearing on exam today - Observation - Call clinic for new or changing moles - Recommend daily use of broad spectrum spf 30+ sunscreen to sun-exposed areas.   Hemangiomas - Red papules -  Discussed benign nature - Observe - Call for any changes  Actinic Damage - Chronic condition, secondary to cumulative UV/sun exposure - diffuse scaly erythematous macules with underlying dyspigmentation - Recommend daily broad spectrum sunscreen SPF 30+ to sun-exposed areas, reapply every 2 hours as needed.  - Staying in the shade or wearing long sleeves, sun glasses (UVA+UVB protection) and wide brim hats (4-inch brim around the entire circumference of the hat) are also recommended for sun protection.  - Call for new or changing lesions.  Dermatofibroma - R thigh, L med calf, L buttock - Firm pink/brown papulenodule with dimple sign - Benign appearing - Call for any changes  Skin cancer screening performed today.  Return for FBSE in 1-3 years.  Luther Redo, CMA, am acting as scribe for Forest Gleason, MD .  Documentation: I have reviewed the above documentation for accuracy and completeness, and I agree with the above.  Forest Gleason, MD

## 2022-10-09 NOTE — Patient Instructions (Signed)
Medication Instructions:   Your physician recommends that you continue on your current medications as directed. Please refer to the Current Medication list given to you today.   *If you need a refill on your cardiac medications before your next appointment, please call your pharmacy*   Testing/Procedures:  Your physician has requested that you have an echocardiogram in 3 weeks. Echocardiography is a painless test that uses sound waves to create images of your heart. It provides your doctor with information about the size and shape of your heart and how well your heart's chambers and valves are working. This procedure takes approximately one hour. There are no restrictions for this procedure. Please do NOT wear cologne, perfume, aftershave, or lotions (deodorant is allowed). Please arrive 15 minutes prior to your appointment time.    2.   Your physician has recommended that you wear a Zio XT monitor for 2 weeks. This will be mailed to your home address in 4-5 business days.   Your clinician has requested a Zio heart rhythm monitor by [employer] to be mailed to your home for you to wear for 14 days. You should expect a small box to arrive via USPS (or FedEx in some cases) within this next week. If you do not receive it please call [employer] at [phone].  Closely watching your heart at this time will help your care team understand more and provide information needed to develop your plan of care.  Please apply your Zio patch monitor the day you receive it. Keep this packaging, you will use this to return your Zio monitor.  You will easily be able to apply the monitor with the instructions provided in the Patient Guide.  If you need assistance, [employer] representatives are available 24/7 at [phone].  You can also download the [facility] app on your phone to view detailed application instructions and log symptoms.  After you wear your monitor for 14 days, place it back in the blue box or  envelope, along with your Symptom Log.  To send your monitor back: Simply use the pre-addressed and pre-paid box/envelope.  Send it back through [employer] the same day you remove it via your local post office or by placing it in your mailbox.  As soon as we receive the results, they will be reviewed and your clinician will contact you.  For the first 24 hours- it is essential to not shower or exercise, to allow the patch to adhere to your skin. Avoid excessive sweating to help maximize wear time. Do not submerge the device, no hot tubs, and no swimming pools. Keep any lotions or oils away from the patch. After 24 hours you may shower with the patch on. Take brief showers with your back facing the shower head.  Do not remove patch once it has been placed because that will interrupt data and decrease adhesive wear time. Push the button when you have any symptoms and write down what you were feeling. Once you have completed wearing your monitor, remove and place into box which has postage paid and place in your outgoing mailbox.  If for some reason you have misplaced your box then call our office and we can provide another box and/or mail it off for you.    Follow-Up: At [facility], you and your health needs are our priority.  As part of our continuing mission to provide you with exceptional heart care, we have created designated Provider Care Teams.  These Care Teams include your primary  Cardiologist (physician) and Advanced Practice Providers (APPs -  Physician Assistants and Nurse Practitioners) who all work together to provide you with the care you need, when you need it.  We recommend signing up for the patient portal called "MyChart".  Sign up information is provided on this After Visit Summary.  MyChart is used to connect with patients for Virtual Visits (Telemedicine).  Patients are able to view lab/test results, encounter notes, upcoming appointments, etc.  Non-urgent messages  can be sent to your provider as well.   To learn more about what you can do with MyChart, go to [email or web address].    Your next appointment:   6-8 week(s)  The format for your next appointment:   In Person  Provider:   Kate Sable, MD    Other Instructions   Important Information About Sugar

## 2022-10-09 NOTE — Patient Instructions (Addendum)

## 2022-10-13 ENCOUNTER — Encounter: Payer: Self-pay | Admitting: Dermatology

## 2022-10-13 DIAGNOSIS — I480 Paroxysmal atrial fibrillation: Secondary | ICD-10-CM | POA: Diagnosis not present

## 2022-10-13 NOTE — Addendum Note (Signed)
Addended by: Nestor Ramp on: 10/13/2022 02:37 PM   Modules accepted: Orders

## 2022-10-20 ENCOUNTER — Ambulatory Visit (HOSPITAL_COMMUNITY): Payer: BC Managed Care – PPO | Attending: Cardiology

## 2022-10-20 DIAGNOSIS — I480 Paroxysmal atrial fibrillation: Secondary | ICD-10-CM | POA: Insufficient documentation

## 2022-10-20 LAB — ECHOCARDIOGRAM COMPLETE
Calc EF: 41.2 %
S' Lateral: 2.8 cm
Single Plane A2C EF: 36.9 %
Single Plane A4C EF: 42.1 %

## 2022-10-28 ENCOUNTER — Other Ambulatory Visit
Admission: RE | Admit: 2022-10-28 | Discharge: 2022-10-28 | Disposition: A | Discharge status: Home / Self Care | Payer: BC Managed Care – PPO | Source: Home / Self Care | Attending: Medical | Admitting: Medical

## 2022-10-28 ENCOUNTER — Ambulatory Visit: Payer: BC Managed Care – PPO | Attending: Medical | Admitting: Medical

## 2022-10-28 ENCOUNTER — Encounter: Payer: Self-pay | Admitting: Medical

## 2022-10-28 VITALS — BP 130/90 | HR 75 | Ht 63.75 in | Wt 150.0 lb

## 2022-10-28 DIAGNOSIS — I4819 Other persistent atrial fibrillation: Secondary | ICD-10-CM

## 2022-10-28 DIAGNOSIS — F419 Anxiety disorder, unspecified: Secondary | ICD-10-CM | POA: Diagnosis not present

## 2022-10-28 DIAGNOSIS — Z7901 Long term (current) use of anticoagulants: Secondary | ICD-10-CM | POA: Diagnosis not present

## 2022-10-28 LAB — BASIC METABOLIC PANEL
Anion gap: 8 (ref 5–15)
BUN: 19 mg/dL (ref 8–23)
CO2: 26 mmol/L (ref 22–32)
Calcium: 9.3 mg/dL (ref 8.9–10.3)
Chloride: 107 mmol/L (ref 98–111)
Creatinine, Ser: 0.77 mg/dL (ref 0.44–1.00)
GFR, Estimated: >60 mL/min (ref >60)
Glucose, Bld: 94 mg/dL (ref 70–99)
Potassium: 3.9 mmol/L (ref 3.5–5.1)
Sodium: 141 mmol/L (ref 135–145)

## 2022-10-28 LAB — CBC
HCT: 45 % (ref 36.0–46.0)
Hemoglobin: 15.3 g/dL — ABNORMAL HIGH (ref 12.0–15.0)
MCH: 31.1 pg (ref 26.0–34.0)
MCHC: 34 g/dL (ref 30.0–36.0)
MCV: 91.5 fL (ref 80.0–100.0)
Platelets: 217 10*3/uL (ref 150–400)
RBC: 4.92 MIL/uL (ref 3.87–5.11)
RDW: 12.6 % (ref 11.5–15.5)
WBC: 7.5 10*3/uL (ref 4.0–10.5)
nRBC: 0 % (ref 0.0–0.2)

## 2022-10-28 LAB — MAGNESIUM: Magnesium: 2.3 mg/dL (ref 1.7–2.4)

## 2022-10-28 LAB — TSH: TSH: 1.763 u[IU]/mL (ref 0.350–4.500)

## 2022-10-28 MED ORDER — DILTIAZEM HCL ER COATED BEADS 120 MG PO CP24: 120.0000 mg | ORAL_CAPSULE | Freq: Every day | ORAL | 3 refills | Status: DC

## 2022-10-28 NOTE — H&P (View-Only) (Signed)
Cardiology Office Note:    Date:  10/28/2022   ID:  Kathy Holt, DOB July 26, 1951, MRN [id number]  PCP:  Bo Merino, FNP  [facility] Cardiologist:  None  [facility] Electrophysiologist:  None   Referring MD: Bo Merino, FNP   Chief Complaint: 1 month follow-up  History of Present Illness:    Kathy Holt is a 71 y.o. female with a hx of paroxysmal A-fib and anxiety who presents for 1 month follow-up.  Patient was diagnosed with A-fib back in 2019 when getting ready to have a colonoscopy.  She takes Xarelto for stroke prophylactics.  She was previously on metoprolol, but did not like how made her feel.    Outside stress echo 02/2031 showed normal EF.  Patient was last seen 10/09/2022 as a new patient.  Patient reported she was under a lot of stress.  Patient was in A-fib.  Heart monitor was ordered.  Today, the patient is in Afib with controlled rates. She feels she has been in afib for about a month consecutively. She also wonders if she was in and out before this. She denies chest pain, SOB, palpitations, lower leg edema, orthopnea, or pnd.  She has stress at home as she is the main caregiver for her husband. She is taking Xarelto and hasn't missed any doses. She is willing to pursue cardioversion, however they will be out of town the next 2 weeks.   Past Medical History:  Diagnosis Date   Arthritis    lower back   DDD (degenerative disc disease), cervical    Dysrhythmia    Afib    Past Surgical History:  Procedure Laterality Date   ABDOMINAL HYSTERECTOMY     partial   BLADDER SUSPENSION  1998   COLONOSCOPY     KNEE ARTHROSCOPY WITH MEDIAL MENISECTOMY Left 01/30/2021   Procedure: KNEE ARTHROSCOPY WITH PARTIAL MEDIAL/LATERAL MENISECTOMY, PARTIAL SYNOVECTOMY;  Surgeon: Lovell Sheehan, MD;  Location: [facility] ORS;  Service: Orthopedics;  Laterality: Left;    Current Medications: Current Meds  Medication Sig   calcium acetate, Phos Binder,  (PHOSLYRA) 667 MG/5ML SOLN Take by mouth 3 (three) times daily with meals.   calcium carbonate (OSCAL) 1500 (600 Ca) MG TABS tablet Take 600 mg of elemental calcium by mouth 2 (two) times daily with a meal.   Cholecalciferol 50 MCG (2000 UT) TABS Take 2,000 Units by mouth daily.   diazepam (VALIUM) 2 MG tablet Take 2 mg by mouth 2 (two) times daily.   Magnesium 200 MG TABS Take 200 mg by mouth at bedtime.   MAGNESIUM GLYCINATE PO Take 400 mg by mouth at bedtime.   Multiple Vitamin (MULTI-VITAMIN DAILY PO) Take 1 tablet by mouth daily.   OVER THE COUNTER MEDICATION Take 1 tablet by mouth daily. Calcium Algae   rivaroxaban (XARELTO) 20 MG TABS tablet Take 20 mg by mouth daily with supper.   tacrolimus (PROTOPIC) 0.1 % ointment Apply topically 2 (two) times daily.   traZODone (DESYREL) 50 MG tablet Take 0.5 tablets (25 mg total) by mouth at bedtime.     Allergies:   Latex   Social History   Socioeconomic History   Marital status: Married    Spouse name: andrew   Number of children: Not on file   Years of education: Not on file   Highest education level: Not on file  Occupational History   Occupation: run Technical sales engineer. at [employer]    Comment: full time  Tobacco Use  Smoking status: Never   Smokeless tobacco: Never  Vaping Use   Vaping Use: Never used  Substance and Sexual Activity   Alcohol use: Never    Comment: not in 20 years   Drug use: Never   Sexual activity: Not Currently  Other Topics Concern   Not on file  Social History Narrative   Patient lives with husband and feels safe in her home.  She still works full time.   Social Determinants of Health   Financial Resource Strain: Low Risk  (10/06/2022)   Overall Financial Resource Strain (CARDIA)    Difficulty of Paying Living Expenses: Not hard at all  Food Insecurity: No Food Insecurity (10/06/2022)   Hunger Vital Sign    Worried About Running Out of Food in the Last Year: Never true    Ran Out of Food in  the Last Year: Never true  Transportation Needs: No Transportation Needs (10/06/2022)   PRAPARE - Hydrologist (Medical): No    Lack of Transportation (Non-Medical): No  Physical Activity: Sufficiently Active (10/06/2022)   Exercise Vital Sign    Days of Exercise per Week: 5 days    Minutes of Exercise per Session: 30 min  Stress: Stress Concern Present (10/06/2022)   [place]    Feeling of Stress : Very much  Social Connections: Moderately Isolated (10/06/2022)   Social Connection and Isolation Panel [NHANES]    Frequency of Communication with Friends and Family: Three times a week    Frequency of Social Gatherings with Friends and Family: Three times a week    Attends Religious Services: Never    Active Member of Clubs or Organizations: No    Attends Archivist Meetings: Never    Marital Status: Married     Family History: [name]; [name]; [name].  ROS:   Please see the history of present illness.     All other systems reviewed and are negative.  EKGs/Labs/Other Studies Reviewed:    The following studies were reviewed today:  Echo 10/20/22  1. Left ventricular ejection fraction, by estimation, is 40 to 45%. Left  ventricular ejection fraction by 2D MOD biplane is 41.2 %. The left  ventricle has mildly decreased function. The left ventricle demonstrates  global hypokinesis. Left ventricular  diastolic function could not be evaluated.   2. Right ventricular systolic function is normal. The right ventricular  size is normal. There is normal pulmonary artery systolic pressure. The  estimated right ventricular systolic pressure is 78.6 mmHg.   3. Left atrial size was massively dilated.   4. Right atrial size was massively dilated.   5. The mitral valve is abnormal. Mild  mitral valve regurgitation.   6. The aortic valve is tricuspid. Aortic valve regurgitation is not  visualized.   7. The inferior vena cava is normal in size with greater than 50%  respiratory variability, suggesting right atrial pressure of 3 mmHg.    EKG:  EKG is ordered today.  The ekg ordered today demonstrates Afib, 75bpm, nonspecific ST changes  Recent Labs: 10/06/2022: ALT 16; BUN 15; Creat 0.73; Hemoglobin 15.0; Platelets 213; Potassium 4.8; Sodium 141  Recent Lipid Panel    Component Value Date/Time   CHOL 188 10/06/2022 1417   TRIG 71 10/06/2022 1417   HDL 82 10/06/2022 1417   CHOLHDL 2.3 10/06/2022 1417  [place] 90 10/06/2022 1417     Physical Exam:    VS:  BP (!) 130/90 (BP Location: Left Arm, Patient Position: Sitting, Cuff Size: Normal)   Pulse 75   Ht 5' 3.75" (1.619 m)   Wt 150 lb (68 kg)   LMP  (LMP Unknown)   SpO2 98%   BMI 25.95 kg/m     Wt Readings from Last 3 Encounters:  10/28/22 150 lb (68 kg)  10/09/22 145 lb (65.8 kg)  10/06/22 146 lb 6.4 oz (66.4 kg)     GEN:  Well nourished, well developed in no acute distress HEENT: Normal NECK: No JVD; No carotid bruits LYMPHATICS: No lymphadenopathy CARDIAC: Irreg IIreg, no murmurs, rubs, gallops RESPIRATORY:  Clear to auscultation without rales, wheezing or rhonchi  ABDOMEN: Soft, non-tender, non-distended MUSCULOSKELETAL:  No edema; No deformity  SKIN: Warm and dry NEUROLOGIC:  Alert and oriented x 3 PSYCHIATRIC:  Normal affect   ASSESSMENT:    1. Persistent atrial fibrillation (HCC)    PLAN:    In order of problems listed above:  Persistent Afib Patient is in persistent Afib with controlled rates. EKG today showed Afib with a heart rate of 75bpm.  She is overall asymptomatic. She has been taking Xarelto '20mg'$ , denies missing doses. She is not on rate controlling medication. I will check a TSH and Mag. She did not tolerate metoprolol in the past. I will start Diltiazem '120mg'$  daily. I will set  her up for cardioversion. She is going out of town, so unsure if this will be before after vacation.  Patient has follow-up with Dr. Quentin Ore first week in December.  Disposition: Follow up 3-4 weeks with MD/APP   Signed, Cordaryl Decelles Ninfa Meeker, PA-C  10/28/2022 9:04 AM    [facility]

## 2022-10-28 NOTE — Progress Notes (Signed)
Cardiology Office Note:    Date:  10/28/2022   ID:  MANPREET KEMMER, DOB June 22, 1951, MRN [id number]  PCP:  Bo Merino, FNP  [facility] Cardiologist:  None  [facility] Electrophysiologist:  None   Referring MD: Bo Merino, FNP   Chief Complaint: 1 month follow-up  History of Present Illness:    Kathy Holt is a 71 y.o. female with a hx of paroxysmal A-fib and anxiety who presents for 1 month follow-up.  Patient was diagnosed with A-fib back in 2019 when getting ready to have a colonoscopy.  She takes Xarelto for stroke prophylactics.  She was previously on metoprolol, but did not like how made her feel.    Outside stress echo 02/2031 showed normal EF.  Patient was last seen 10/09/2022 as a new patient.  Patient reported she was under a lot of stress.  Patient was in A-fib.  Heart monitor was ordered.  Today, the patient is in Afib with controlled rates. She feels she has been in afib for about a month consecutively. She also wonders if she was in and out before this. She denies chest pain, SOB, palpitations, lower leg edema, orthopnea, or pnd.  She has stress at home as she is the main caregiver for her husband. She is taking Xarelto and hasn't missed any doses. She is willing to pursue cardioversion, however they will be out of town the next 2 weeks.   Past Medical History:  Diagnosis Date   Arthritis    lower back   DDD (degenerative disc disease), cervical    Dysrhythmia    Afib    Past Surgical History:  Procedure Laterality Date   ABDOMINAL HYSTERECTOMY     partial   BLADDER SUSPENSION  1998   COLONOSCOPY     KNEE ARTHROSCOPY WITH MEDIAL MENISECTOMY Left 01/30/2021   Procedure: KNEE ARTHROSCOPY WITH PARTIAL MEDIAL/LATERAL MENISECTOMY, PARTIAL SYNOVECTOMY;  Surgeon: Lovell Sheehan, MD;  Location: [facility] ORS;  Service: Orthopedics;  Laterality: Left;    Current Medications: Current Meds  Medication Sig   calcium acetate, Phos Binder,  (PHOSLYRA) 667 MG/5ML SOLN Take by mouth 3 (three) times daily with meals.   calcium carbonate (OSCAL) 1500 (600 Ca) MG TABS tablet Take 600 mg of elemental calcium by mouth 2 (two) times daily with a meal.   Cholecalciferol 50 MCG (2000 UT) TABS Take 2,000 Units by mouth daily.   diazepam (VALIUM) 2 MG tablet Take 2 mg by mouth 2 (two) times daily.   Magnesium 200 MG TABS Take 200 mg by mouth at bedtime.   MAGNESIUM GLYCINATE PO Take 400 mg by mouth at bedtime.   Multiple Vitamin (MULTI-VITAMIN DAILY PO) Take 1 tablet by mouth daily.   OVER THE COUNTER MEDICATION Take 1 tablet by mouth daily. Calcium Algae   rivaroxaban (XARELTO) 20 MG TABS tablet Take 20 mg by mouth daily with supper.   tacrolimus (PROTOPIC) 0.1 % ointment Apply topically 2 (two) times daily.   traZODone (DESYREL) 50 MG tablet Take 0.5 tablets (25 mg total) by mouth at bedtime.     Allergies:   Latex   Social History   Socioeconomic History   Marital status: Married    Spouse name: andrew   Number of children: Not on file   Years of education: Not on file   Highest education level: Not on file  Occupational History   Occupation: run Technical sales engineer. at [employer]    Comment: full time  Tobacco Use  Smoking status: Never   Smokeless tobacco: Never  Vaping Use   Vaping Use: Never used  Substance and Sexual Activity   Alcohol use: Never    Comment: not in 20 years   Drug use: Never   Sexual activity: Not Currently  Other Topics Concern   Not on file  Social History Narrative   Patient lives with husband and feels safe in her home.  She still works full time.   Social Determinants of Health   Financial Resource Strain: Low Risk  (10/06/2022)   Overall Financial Resource Strain (CARDIA)    Difficulty of Paying Living Expenses: Not hard at all  Food Insecurity: No Food Insecurity (10/06/2022)   Hunger Vital Sign    Worried About Running Out of Food in the Last Year: Never true    Ran Out of Food in  the Last Year: Never true  Transportation Needs: No Transportation Needs (10/06/2022)   PRAPARE - Hydrologist (Medical): No    Lack of Transportation (Non-Medical): No  Physical Activity: Sufficiently Active (10/06/2022)   Exercise Vital Sign    Days of Exercise per Week: 5 days    Minutes of Exercise per Session: 30 min  Stress: Stress Concern Present (10/06/2022)   [place]    Feeling of Stress : Very much  Social Connections: Moderately Isolated (10/06/2022)   Social Connection and Isolation Panel [NHANES]    Frequency of Communication with Friends and Family: Three times a week    Frequency of Social Gatherings with Friends and Family: Three times a week    Attends Religious Services: Never    Active Member of Clubs or Organizations: No    Attends Archivist Meetings: Never    Marital Status: Married     Family History: [name]; [name]; [name].  ROS:   Please see the history of present illness.     All other systems reviewed and are negative.  EKGs/Labs/Other Studies Reviewed:    The following studies were reviewed today:  Echo 10/20/22  1. Left ventricular ejection fraction, by estimation, is 40 to 45%. Left  ventricular ejection fraction by 2D MOD biplane is 41.2 %. The left  ventricle has mildly decreased function. The left ventricle demonstrates  global hypokinesis. Left ventricular  diastolic function could not be evaluated.   2. Right ventricular systolic function is normal. The right ventricular  size is normal. There is normal pulmonary artery systolic pressure. The  estimated right ventricular systolic pressure is 16.1 mmHg.   3. Left atrial size was massively dilated.   4. Right atrial size was massively dilated.   5. The mitral valve is abnormal. Mild  mitral valve regurgitation.   6. The aortic valve is tricuspid. Aortic valve regurgitation is not  visualized.   7. The inferior vena cava is normal in size with greater than 50%  respiratory variability, suggesting right atrial pressure of 3 mmHg.    EKG:  EKG is ordered today.  The ekg ordered today demonstrates Afib, 75bpm, nonspecific ST changes  Recent Labs: 10/06/2022: ALT 16; BUN 15; Creat 0.73; Hemoglobin 15.0; Platelets 213; Potassium 4.8; Sodium 141  Recent Lipid Panel    Component Value Date/Time   CHOL 188 10/06/2022 1417   TRIG 71 10/06/2022 1417   HDL 82 10/06/2022 1417   CHOLHDL 2.3 10/06/2022 1417  [place] 90 10/06/2022 1417     Physical Exam:    VS:  BP (!) 130/90 (BP Location: Left Arm, Patient Position: Sitting, Cuff Size: Normal)   Pulse 75   Ht 5' 3.75" (1.619 m)   Wt 150 lb (68 kg)   LMP  (LMP Unknown)   SpO2 98%   BMI 25.95 kg/m     Wt Readings from Last 3 Encounters:  10/28/22 150 lb (68 kg)  10/09/22 145 lb (65.8 kg)  10/06/22 146 lb 6.4 oz (66.4 kg)     GEN:  Well nourished, well developed in no acute distress HEENT: Normal NECK: No JVD; No carotid bruits LYMPHATICS: No lymphadenopathy CARDIAC: Irreg IIreg, no murmurs, rubs, gallops RESPIRATORY:  Clear to auscultation without rales, wheezing or rhonchi  ABDOMEN: Soft, non-tender, non-distended MUSCULOSKELETAL:  No edema; No deformity  SKIN: Warm and dry NEUROLOGIC:  Alert and oriented x 3 PSYCHIATRIC:  Normal affect   ASSESSMENT:    1. Persistent atrial fibrillation (HCC)    PLAN:    In order of problems listed above:  Persistent Afib Patient is in persistent Afib with controlled rates. EKG today showed Afib with a heart rate of 75bpm.  She is overall asymptomatic. She has been taking Xarelto '20mg'$ , denies missing doses. She is not on rate controlling medication. I will check a TSH and Mag. She did not tolerate metoprolol in the past. I will start Diltiazem '120mg'$  daily. I will set  her up for cardioversion. She is going out of town, so unsure if this will be before after vacation.  Patient has follow-up with Dr. Quentin Ore first week in December.  Disposition: Follow up 3-4 weeks with MD/APP   Signed, Taelyr Jantz Ninfa Meeker, PA-C  10/28/2022 9:04 AM    [facility]

## 2022-10-28 NOTE — Patient Instructions (Addendum)
Medication Instructions:  START: Diltiazem 120 mg daily  *If you need a refill on your cardiac medications before your next appointment, please call your pharmacy*   Lab Work: Your provider would like for you to have the following labs drawn today: (CBC, BMP, TSH, Mg).   Please go to the [facility] entrance and check in at the front desk.  You do not need an appointment.  They are open from 7am-6 pm.  You do not need to be fasting.  If you have labs (blood work) drawn today and your tests are completely normal, you will receive your results only by: [place] (if you have MyChart) OR A paper copy in the mail If you have any lab test that is abnormal or we need to change your treatment, we will call you to review the results.   Testing/Procedures:    You are scheduled for a Cardioversion on Thursday 10/30/22 with Dr. Rockey Situ.  Please arrive at the [facility], [place], [place] at 7 am  After entering the [place] please check-in at the registration desk (1st desk on your right) to receive your armband. (1 hour prior to procedure unless lab work is needed; if lab work is needed arrive 1.5 hours ahead)  DIET: Nothing to eat or drink after midnight except a sip of water with medications (see medication instructions below)  Medication Instructions: Continue your anticoagulant: Xarelto If you miss a dose, please call us at [phone] You will need to continue your anticoagulant after your procedure until you are told by your provider that it is safe to stop.   Labs: If patient is on Coumadin, patient needs pt/INR, CBC, BMET within 3 days (No pt/INR needed for patients taking Xarelto, Eliquis, Pradaxa) For patients receiving anesthesia for TEE and all Cardioversion patients: BMET, CBC within 1 week  FYI: For your safety, and to allow [place] to monitor your vital signs accurately during the surgery/procedure we request that if you have  artificial nails, gel coating, SNS etc. Please have those removed prior to your surgery/procedure. Not having the nail coverings /polish removed may result in cancellation or delay of your surgery/procedure.  You must have a responsible person to drive you home and stay in the waiting area during your procedure. Failure to do so could result in cancellation.  Bring your insurance cards.  If you have any questions after you get home, please call the office at [phone]  *Special Note: Every effort is made to have your procedure done on time. Occasionally there are emergencies that occur at the hospital that may cause delays. Please be patient if a delay does occur.     Follow-Up: At [facility], you and your health needs are our priority.  As part of our continuing mission to provide you with exceptional heart care, we have created designated Provider Care Teams.  These Care Teams include your primary Cardiologist (physician) and Advanced Practice Providers (APPs -  Physician Assistants and Nurse Practitioners) who all work together to provide you with the care you need, when you need it.  We recommend signing up for the patient portal called "MyChart".  Sign up information is provided on this After Visit Summary.  MyChart is used to connect with patients for Virtual Visits (Telemedicine).  Patients are able to view lab/test results, encounter notes, upcoming appointments, etc.  Non-urgent messages can be sent to your provider as well.   To learn more about  what you can do with MyChart, go to [email or web address].    Your next appointment:   November 19, 2022 @ 10:40 am  The format for your next appointment:   In Person  Provider:   Lars Mage, MD

## 2022-10-30 ENCOUNTER — Telehealth: Payer: Self-pay | Admitting: Cardiovascular Disease

## 2022-10-30 ENCOUNTER — Ambulatory Visit
Admission: RE | Admit: 2022-10-30 | Discharge: 2022-10-30 | Disposition: A | Discharge status: Home / Self Care | Payer: BC Managed Care – PPO | Attending: Cardiovascular Disease | Admitting: Cardiovascular Disease

## 2022-10-30 ENCOUNTER — Encounter: Payer: Self-pay | Admitting: Cardiovascular Disease

## 2022-10-30 ENCOUNTER — Other Ambulatory Visit: Payer: Self-pay

## 2022-10-30 ENCOUNTER — Ambulatory Visit: Payer: BC Managed Care – PPO | Admitting: Anesthesiology

## 2022-10-30 ENCOUNTER — Encounter
Admission: RE | Disposition: A | Discharge status: Home / Self Care | Payer: Self-pay | Source: Home / Self Care | Attending: Cardiovascular Disease

## 2022-10-30 ENCOUNTER — Other Ambulatory Visit: Payer: Self-pay | Admitting: Cardiovascular Disease

## 2022-10-30 DIAGNOSIS — I4819 Other persistent atrial fibrillation: Secondary | ICD-10-CM | POA: Diagnosis not present

## 2022-10-30 DIAGNOSIS — Z7901 Long term (current) use of anticoagulants: Secondary | ICD-10-CM | POA: Insufficient documentation

## 2022-10-30 DIAGNOSIS — F419 Anxiety disorder, unspecified: Secondary | ICD-10-CM | POA: Insufficient documentation

## 2022-10-30 DIAGNOSIS — R0602 Shortness of breath: Secondary | ICD-10-CM

## 2022-10-30 HISTORY — PX: CARDIOVERSION: SHX1299

## 2022-10-30 LAB — COLOGUARD: COLOGUARD: NEGATIVE

## 2022-10-30 SURGERY — CARDIOVERSION
Anesthesia: General

## 2022-10-30 MED ORDER — BISOPROLOL FUMARATE 5 MG PO TABS: 5.0000 mg | ORAL_TABLET | Freq: Every day | ORAL | 3 refills | Status: DC

## 2022-10-30 MED ORDER — SODIUM CHLORIDE 0.9 % IV SOLN: INTRAVENOUS | Status: DC

## 2022-10-30 MED ORDER — PROPOFOL 10 MG/ML IV BOLUS
INTRAVENOUS | Status: DC | PRN
  Administered 2022-10-30: 30 mg via INTRAVENOUS
  Administered 2022-10-30: 40 mg via INTRAVENOUS

## 2022-10-30 MED ORDER — PROPOFOL 10 MG/ML IV BOLUS
INTRAVENOUS | Status: AC
  Filled 2022-10-30: qty 20

## 2022-10-30 MED ORDER — SODIUM CHLORIDE 0.9 % IV SOLN: INTRAVENOUS | Status: DC | PRN

## 2022-10-30 NOTE — Anesthesia Procedure Notes (Signed)
Date/Time: 10/30/2022 7:37 AM  Performed by: Lily Peer, Lauri Purdum, CRNAPre-anesthesia Checklist: Patient identified, Emergency Drugs available, Suction available, Patient being monitored and Timeout performed Patient Re-evaluated:Patient Re-evaluated prior to induction Oxygen Delivery Method: Nasal cannula Induction Type: IV induction

## 2022-10-30 NOTE — CV Procedure (Addendum)
Cardioversion procedure note For atrial fibrillation, persistent.  Procedure Details:  Consent: Risks of procedure as well as the alternatives and risks of each were explained to the (patient/caregiver).  Consent for procedure obtained.  Time Out: Verified patient identification, verified procedure, site/side was marked, verified correct patient position, special equipment/implants available, medications/allergies/relevent history reviewed, required imaging and test results available.  Performed  Patient placed on cardiac monitor, pulse oximetry, supplemental oxygen as necessary.   Sedation given: propofol IV, Dr. Amie Critchley Pacer pads placed anterior and posterior chest.   Cardioverted 1 time(s).   Cardioverted at  150 J. Synchronized biphasic Converted to NSR   Evaluation: Findings: Post procedure EKG shows: NSR Complications: None Patient did tolerate procedure well.  She reports that she did not tolerate diltiazem er 120 daily, Took one pill and developed a "migraine" headache, stopped the medication Prior fatigue on metoprolol (dose may have been high?) We will start bisoprolol 5 mg daily  Time Spent Directly with the Patient:  45 minutes   Esmond Plants, M.D., Ph.D.

## 2022-10-30 NOTE — Transfer of Care (Signed)
Immediate Anesthesia Transfer of Care Note  Patient: Kathy Holt  Procedure(s) Performed: CARDIOVERSION  Patient Location: Cath Lab  Anesthesia Type:General  Level of Consciousness: awake, alert , and oriented  Airway & Oxygen Therapy: Patient Spontanous Breathing and Patient connected to nasal cannula oxygen  Post-op Assessment: Report given to RN and Post -op Vital signs reviewed and stable  Post vital signs: Reviewed and stable  Last Vitals:  Vitals Value Taken Time  BP 123/70 10/30/22 0748  Temp    Pulse 65 10/30/22 0749  Resp 18 10/30/22 0749  SpO2 96 % 10/30/22 0749    Last Pain:  Vitals:   10/30/22 0718  TempSrc: Oral         Complications: No notable events documented.

## 2022-10-30 NOTE — Anesthesia Preprocedure Evaluation (Signed)
Anesthesia Evaluation  Patient identified by MRN, date of birth, ID band Patient awake    Reviewed: Allergy & Precautions, NPO status , Patient's Chart, lab work & pertinent test results  History of Anesthesia Complications Negative for: history of anesthetic complications  Airway Mallampati: III  TM Distance: >3 FB Neck ROM: full    Dental  (+) Chipped, Poor Dentition   Pulmonary neg pulmonary ROS, neg shortness of breath   Pulmonary exam normal        Cardiovascular Exercise Tolerance: Good (-) angina (-) Past MI + dysrhythmias Atrial Fibrillation  Rhythm:irregular     Neuro/Psych negative neurological ROS  negative psych ROS   GI/Hepatic negative GI ROS, Neg liver ROS,neg GERD  ,,  Endo/Other  negative endocrine ROS    Renal/GU negative Renal ROS  negative genitourinary   Musculoskeletal   Abdominal   Peds  Hematology negative hematology ROS (+)   Anesthesia Other Findings Past Medical History: No date: Arthritis     Comment:  lower back No date: DDD (degenerative disc disease), cervical No date: Dysrhythmia     Comment:  Afib  Past Surgical History: No date: ABDOMINAL HYSTERECTOMY     Comment:  partial 1998: BLADDER SUSPENSION No date: COLONOSCOPY 01/30/2021: KNEE ARTHROSCOPY WITH MEDIAL MENISECTOMY; Left     Comment:  Procedure: KNEE ARTHROSCOPY WITH PARTIAL MEDIAL/LATERAL               MENISECTOMY, PARTIAL SYNOVECTOMY;  Surgeon: Lovell Sheehan, MD;  Location: [facility] ORS;  Service: Orthopedics;                Laterality: Left;     Reproductive/Obstetrics negative OB ROS                             Anesthesia Physical Anesthesia Plan  ASA: 3  Anesthesia Plan: General   Post-op Pain Management:    Induction: Intravenous  PONV Risk Score and Plan: Propofol infusion and TIVA  Airway Management Planned: Natural Airway and Nasal Cannula  Additional  Equipment:   Intra-op Plan:   Post-operative Plan:   Informed Consent: I have reviewed the patients History and Physical, chart, labs and discussed the procedure including the risks, benefits and alternatives for the proposed anesthesia with the patient or authorized representative who has indicated his/her understanding and acceptance.     Dental Advisory Given  Plan Discussed with: Anesthesiologist, CRNA and Surgeon  Anesthesia Plan Comments: (Patient consented for risks of anesthesia including but not limited to:  - adverse reactions to medications - risk of airway placement if required - damage to eyes, teeth, lips or other oral mucosa - nerve damage due to positioning  - sore throat or hoarseness - Damage to heart, brain, nerves, lungs, other parts of body or loss of life  Patient voiced understanding.)       Anesthesia Quick Evaluation

## 2022-10-31 NOTE — Anesthesia Postprocedure Evaluation (Signed)
Anesthesia Post Note  Patient: Kathy Holt  Procedure(s) Performed: CARDIOVERSION  Patient location during evaluation: Specials Recovery Anesthesia Type: General Level of consciousness: awake and alert Pain management: pain level controlled Vital Signs Assessment: post-procedure vital signs reviewed and stable Respiratory status: spontaneous breathing, nonlabored ventilation, respiratory function stable and patient connected to nasal cannula oxygen Cardiovascular status: blood pressure returned to baseline and stable Postop Assessment: no apparent nausea or vomiting Anesthetic complications: no   No notable events documented.   Last Vitals:  Vitals:   10/30/22 0815 10/30/22 0830  BP: (!) 148/85 (!) 139/96  Pulse: (!) 58 (!) 58  Resp: 15 19  Temp:    SpO2: 98% 99%    Last Pain:  Vitals:   10/30/22 0830  TempSrc:   PainSc: 0-No pain                 Precious Haws Jesalyn Finazzo

## 2022-11-01 ENCOUNTER — Other Ambulatory Visit: Payer: Self-pay | Admitting: Nurse Practitioner

## 2022-11-01 DIAGNOSIS — F439 Reaction to severe stress, unspecified: Secondary | ICD-10-CM

## 2022-11-01 DIAGNOSIS — F419 Anxiety disorder, unspecified: Secondary | ICD-10-CM

## 2022-11-02 NOTE — H&P (Signed)
H&P Addendum, pre-cardioversion ° °Patient was seen and evaluated prior to -cardioversion procedure °Symptoms, prior testing details again confirmed with the patient °Patient examined, no significant change from prior exam °Lab work reviewed in detail personally by myself °Patient understands risk and benefit of the procedure,  °The risks (stroke, cardiac arrhythmias rarely resulting in the need for a temporary or permanent pacemaker, skin irritation or burns and complications associated with conscious sedation including aspiration, arrhythmia, respiratory failure and death), benefits (restoration of normal sinus rhythm) and alternatives of a direct current cardioversion were explained in detail °Patient willing to proceed. ° °Signed, °Tim Zaneta Lightcap, MD, Ph.D °[facility]  °

## 2022-11-02 NOTE — Interval H&P Note (Signed)
History and Physical Interval Note:  11/02/2022 10:04 AM  Kathy Holt  has presented today for surgery, with the diagnosis of Cardioversion  Afib OK 2nd case per Dr Barbra Sarks  Start time 8a.  The various methods of treatment have been discussed with the patient and family. After consideration of risks, benefits and other options for treatment, the patient has consented to  Procedure(s): CARDIOVERSION (N/A) as a surgical intervention.  The patient's history has been reviewed, patient examined, no change in status, stable for surgery.  I have reviewed the patient's chart and labs.  Questions were answered to the patient's satisfaction.     Ida Rogue

## 2022-11-03 NOTE — Telephone Encounter (Signed)
Requested medication (s) are due for refill today: yes  Requested medication (s) are on the active medication list: yes  Last refill:  10/06/22 #90 0 refills  Future visit scheduled: yes in 2 months  Notes to clinic:  no refills remain. Do you want to refill Rx?     Requested Prescriptions  Pending Prescriptions Disp Refills   busPIRone (BUSPAR) 5 MG tablet [Pharmacy Med Name: busPIRone HCL 5 MG TABLET] 90 tablet 0    Sig: TAKE 1 TABLET BY MOUTH THREE TIMES A DAY AS NEEDED     Psychiatry: Anxiolytics/Hypnotics - Non-controlled Passed - 11/01/2022  6:51 AM      Passed - Valid encounter within last 12 months    Recent Outpatient Visits           4 weeks ago Atrial fibrillation, unspecified type [facility])   [facility] Bo Merino, FNP       Future Appointments             In 2 months Reece Packer, Myna Hidalgo, [facility], [facility]

## 2022-11-18 NOTE — Progress Notes (Unsigned)
Electrophysiology Office Note:    Date:  11/19/2022   ID:  VEARL AITKEN, DOB May 09, 1951, MRN [id number]  PCP:  Bo Merino, FNP  [facility] Cardiologist:  None  [facility] Electrophysiologist:  Vickie Epley, MD   Referring MD: Kate Sable, MD   Chief Complaint: AF  History of Present Illness:    Kathy Holt is a 71 y.o. female who presents for an evaluation of AF at the request of Cadence Furth, PA-C. Their medical history includes pAF diagnosed in 2019 on xarelto, arthritis. She saw Cadence 10/28/2022.  At that appointment she was in AF. She felt like she was in/out of rhythm for the month prior.   She underwent cardioversion 10/30/2022.  During the last year she has been helping take care of her husband who has esophageal cancer.  He underwent an esophagectomy.  He had a complicated course but is now making steady improvements.  She thinks during a time she was in atrial fibrillation but did not seek care because of how much she was helping take care of her husband.  She has been taking Xarelto for stroke prophylaxis.  She feels fatigued when in atrial fibrillation.  She also feels the irregularity and is also described, and atypical chest discomfort in her shoulder blade when in atrial fibrillation.  Rates are fairly well-controlled on bisoprolol.  Her husband was previously prescribed amiodarone.  She did not tolerate metoprolol and is in general quite sensitive to medications.       Past Medical History:  Diagnosis Date   Arthritis    lower back   DDD (degenerative disc disease), cervical    Dysrhythmia    Afib    Past Surgical History:  Procedure Laterality Date   ABDOMINAL HYSTERECTOMY     partial   BLADDER SUSPENSION  1998   CARDIOVERSION N/A 10/30/2022   Procedure: CARDIOVERSION;  Surgeon: Minna Merritts, MD;  Location: [facility] ORS;  Service: Cardiovascular;  Laterality: N/A;   COLONOSCOPY     KNEE ARTHROSCOPY WITH MEDIAL  MENISECTOMY Left 01/30/2021   Procedure: KNEE ARTHROSCOPY WITH PARTIAL MEDIAL/LATERAL MENISECTOMY, PARTIAL SYNOVECTOMY;  Surgeon: Lovell Sheehan, MD;  Location: [facility] ORS;  Service: Orthopedics;  Laterality: Left;    Current Medications: Current Meds  Medication Sig   bisoprolol (ZEBETA) 5 MG tablet Take 1 tablet (5 mg total) by mouth daily.   calcium carbonate (OSCAL) 1500 (600 Ca) MG TABS tablet Take 600 mg of elemental calcium by mouth 2 (two) times daily with a meal.   Cholecalciferol 50 MCG (2000 UT) TABS Take 2,000 Units by mouth daily.   diazepam (VALIUM) 2 MG tablet Take 2 mg by mouth every 6 (six) hours as needed for anxiety.   MAGNESIUM GLYCINATE PO Take 400 mg by mouth at bedtime.   Multiple Vitamin (MULTI-VITAMIN DAILY PO) Take 1 tablet by mouth daily.   OVER THE COUNTER MEDICATION Take 1 tablet by mouth daily. Calcium Algae   rivaroxaban (XARELTO) 20 MG TABS tablet Take 20 mg by mouth daily with supper.   tacrolimus (PROTOPIC) 0.1 % ointment Apply topically 2 (two) times daily.   [DISCONTINUED] Magnesium 200 MG TABS Take 200 mg by mouth at bedtime.   [DISCONTINUED] traZODone (DESYREL) 50 MG tablet Take 0.5 tablets (25 mg total) by mouth at bedtime.     Allergies:   Latex   Social History   Socioeconomic History   Marital status: Married    Spouse name: andrew   Number of children:  Not on file   Years of education: Not on file   Highest education level: Not on file  Occupational History   Occupation: run interior Engineering geologist. at [employer]    Comment: full time  Tobacco Use   Smoking status: Never   Smokeless tobacco: Never  Vaping Use   Vaping Use: Never used  Substance and Sexual Activity   Alcohol use: Never    Comment: not in 20 years   Drug use: Never   Sexual activity: Not Currently  Other Topics Concern   Not on file  Social History Narrative   Patient lives with husband and feels safe in her home.  She still works full time.   Social Determinants of  Health   Financial Resource Strain: Low Risk  (10/06/2022)   Overall Financial Resource Strain (CARDIA)    Difficulty of Paying Living Expenses: Not hard at all  Food Insecurity: No Food Insecurity (10/06/2022)   Hunger Vital Sign    Worried About Running Out of Food in the Last Year: Never true    Ran Out of Food in the Last Year: Never true  Transportation Needs: No Transportation Needs (10/06/2022)   PRAPARE - Hydrologist (Medical): No    Lack of Transportation (Non-Medical): No  Physical Activity: Sufficiently Active (10/06/2022)   Exercise Vital Sign    Days of Exercise per Week: 5 days    Minutes of Exercise per Session: 30 min  Stress: Stress Concern Present (10/06/2022)   [place]    Feeling of Stress : Very much  Social Connections: Moderately Isolated (10/06/2022)   Social Connection and Isolation Panel [NHANES]    Frequency of Communication with Friends and Family: Three times a week    Frequency of Social Gatherings with Friends and Family: Three times a week    Attends Religious Services: Never    Active Member of Clubs or Organizations: No    Attends Archivist Meetings: Never    Marital Status: Married     Family History: [name]; [name]; [name].  ROS:   Please see the history of present illness.    All other systems reviewed and are negative.  EKGs/Labs/Other Studies Reviewed:    The following studies were reviewed today:  10/29/2022 Zio 100% AF burden Rare ventricular ectopy  10/20/2022 Echo EF 40-45% RV normal LA dilated Mild MR    EKG:  The ekg ordered today demonstrates atrial fibrillation with ventricular rate of 71 bpm.  QTc of 443 ms.   Recent Labs: 10/06/2022: ALT 16 10/28/2022: BUN 19; Creatinine, Ser 0.77; Hemoglobin 15.3;  Magnesium 2.3; Platelets 217; Potassium 3.9; Sodium 141; TSH 1.763  Recent Lipid Panel    Component Value Date/Time   CHOL 188 10/06/2022 1417   TRIG 71 10/06/2022 1417   HDL 82 10/06/2022 1417   CHOLHDL 2.3 10/06/2022 1417   LDLCALC 90 10/06/2022 1417    Physical Exam:    VS:  BP 132/80   Pulse 71   Ht 5' 3.5" (1.613 m)   Wt 150 lb (68 kg)   LMP  (LMP Unknown)   SpO2 98%   BMI 26.15 kg/m     Wt Readings from Last 3 Encounters:  11/19/22 150 lb (68 kg)  10/30/22 148 lb (67.1 kg)  10/28/22 150 lb (68 kg)  GEN:  Well nourished, well developed in no acute distress HEENT: Normal NECK: No JVD; No carotid bruits LYMPHATICS: No lymphadenopathy CARDIAC: Irregularly irregular, no murmurs, rubs, gallops RESPIRATORY:  Clear to auscultation without rales, wheezing or rhonchi  ABDOMEN: Soft, non-tender, non-distended MUSCULOSKELETAL:  No edema; No deformity  SKIN: Warm and dry NEUROLOGIC:  Alert and oriented x 3 PSYCHIATRIC:  Normal affect       ASSESSMENT:    1. Persistent atrial fibrillation ([place])   2. Primary hypertension    PLAN:    In order of problems listed above:  #Persistent AF With reduced EF and symptoms, rhythm control indicated.  I would like to get her back in rhythm sooner than her scheduled ablation likely in March to see if we can help improve her LV function.  I discussed Tikosyn loading in detail with the patient and she is willing to proceed.  Her QTc and creatinine are okay to start the medication.  The risks of this drug were discussed with the patient.  Discussed treatment options today for their AF including antiarrhythmic drug therapy and ablation. Discussed risks, recovery and likelihood of success. Discussed potential need for repeat ablation procedures and antiarrhythmic drugs after an initial ablation. They wish to proceed with scheduling.  Risk, benefits, and alternatives to EP study and radiofrequency ablation for afib were also  discussed in detail today. These risks include but are not limited to stroke, bleeding, vascular damage, tamponade, perforation, damage to the esophagus, lungs, and other structures, pulmonary vein stenosis, worsening renal function, and death. The patient understands these risk and wishes to proceed.  We will therefore proceed with catheter ablation at the next available time.  Carto, ICE, anesthesia are requested for the procedure.  Will also obtain CT PV protocol prior to the procedure to exclude LAA thrombus and further evaluate atrial anatomy.  On xarelto for stroke ppx.  #Hypertension At goal today.  Recommend checking blood pressures 1-2 times per week at home and recording the values.  Recommend bringing these recordings to the primary care physician.    Medication Adjustments/Labs and Tests Ordered: Current medicines are reviewed at length with the patient today.  Concerns regarding medicines are outlined above.  No orders of the defined types were placed in this encounter.  No orders of the defined types were placed in this encounter.    Signed, Hilton Cork. Quentin Ore, MD, [facility], [facility] 11/19/2022 9:44 AM    Electrophysiology [facility]

## 2022-11-19 ENCOUNTER — Ambulatory Visit: Payer: BC Managed Care – PPO | Attending: Cardiology | Admitting: Cardiology

## 2022-11-19 ENCOUNTER — Encounter: Payer: Self-pay | Admitting: *Deleted

## 2022-11-19 ENCOUNTER — Encounter: Payer: Self-pay | Admitting: Cardiology

## 2022-11-19 VITALS — BP 132/80 | HR 71 | Ht 63.5 in | Wt 150.0 lb

## 2022-11-19 DIAGNOSIS — I4819 Other persistent atrial fibrillation: Secondary | ICD-10-CM

## 2022-11-19 DIAGNOSIS — Z01812 Encounter for preprocedural laboratory examination: Secondary | ICD-10-CM | POA: Diagnosis not present

## 2022-11-19 DIAGNOSIS — I10 Essential (primary) hypertension: Secondary | ICD-10-CM | POA: Diagnosis not present

## 2022-11-19 NOTE — Patient Instructions (Addendum)
Medication Instructions:  Your physician recommends that you continue on your current medications as directed. Please refer to the Current Medication list given to you today.  *If you need a refill on your cardiac medications before your next appointment, please call your pharmacy*   Lab Work: Pre procedure labs -- see procedure instruction letter:  BMP & CBC  If you have labs (blood work) drawn today and your tests are completely normal, you will receive your results only by: [place] (if you have MyChart) OR A paper copy in the mail If you have any lab test that is abnormal or we need to change your treatment, we will call you to review the results.   Testing/Procedures: Your physician has requested that you have cardiac CT within 7 days PRIOR to your ablation. Cardiac computed tomography (CT) is a painless test that uses an x-ray machine to take clear, detailed pictures of your heart.  Please follow instruction below located under "other instructions". You will get a call from our office to schedule the date for this test.  Your physician has recommended that you have an ablation. Catheter ablation is a medical procedure used to treat some cardiac arrhythmias (irregular heartbeats). During catheter ablation, a long, thin, flexible tube is put into a blood vessel in your groin (upper thigh), or neck. This tube is called an ablation catheter. It is then guided to your heart through the blood vessel. Radio frequency waves destroy small areas of heart tissue where abnormal heartbeats may cause an arrhythmia to start. Please follow instruction letter given to you today.   Follow-Up: At [facility], you and your health needs are our priority.  As part of our continuing mission to provide you with exceptional heart care, we have created designated Provider Care Teams.  These Care Teams include your primary Cardiologist (physician) and Advanced Practice Providers (APPs -  Physician  Assistants and Nurse Practitioners) who all work together to provide you with the care you need, when you need it.  Your next appointment:   1 month(s) after your ablation  The format for your next appointment:   In Person  Provider:   AFib clinic   Thank you for choosing [facility]!!   [phone]    Other Instructions  Dofetilide (Tikosyn) Admission  Prior to day of admission: Check with drug insurance company for cost of drug to ensure affordability --- Dofetilide 500 mcg twice a day.  GoodRx is an option if insurance copay is unaffordable.  A pharmacist will review all your medications for potential interactions with Tikosyn. If any medication changes are needed prior to admission we will be in touch with you.  If any new medications are started AFTER your admission date is set with [place] [phone]). Please notify our office immediately so your medication list can be updated and reviewed by our pharmacist again.  No Benadryl is allowed 3 days prior to admission.  Please ensure no missed doses of your anticoagulation (blood thinner) for 3 weeks prior to admission. If a dose is missed please notify our office immediately.  Tikosyn initiation requires a 3 night/4 day hospital stay with constant telemetry monitoring. You will have an EKG after each dose of Tikosyn as well as daily lab draws. On day of admission: Afib Clinic office visit on the morning of admission is needed for preliminary labs/ekg.  You may bring personal belongings/clothing with you to the hospital. Please leave your suitcase in the car until you arrive in  admissions.  Time of admission is dependent on bed availability in the hospital. In some instances, you will be sent home until bed is available. Rarely admission can be delayed to the following day if hospital census prevents available beds.  If the drug does not convert you to normal rhythm a cardioversion after the 4th dose of  Tikosyn.  Questions please call our office at [phone]     Cardiac Ablation Cardiac ablation is a procedure to destroy (ablate) some heart tissue that is sending bad signals. These bad signals cause problems in heart rhythm. The heart has many areas that make these signals. If there are problems in these areas, they can make the heart beat in a way that is not normal. Destroying some tissues can help make the heart rhythm normal. Tell your doctor about: Any allergies you have. All medicines you are taking. These include vitamins, herbs, eye drops, creams, and over-the-counter medicines. Any problems you or family members have had with medicines that make you fall asleep (anesthetics). Any blood disorders you have. Any surgeries you have had. Any medical conditions you have, such as kidney failure. Whether you are pregnant or may be pregnant. What are the risks? This is a safe procedure. But problems may occur, including: Infection. Bruising and bleeding. Bleeding into the chest. Stroke or blood clots. Damage to nearby areas of your body. Allergies to medicines or dyes. The need for a pacemaker if the normal system is damaged. Failure of the procedure to treat the problem. What happens before the procedure? Medicines Ask your doctor about: Changing or stopping your normal medicines. This is important. Taking aspirin and ibuprofen. Do not take these medicines unless your doctor tells you to take them. Taking other medicines, vitamins, herbs, and supplements. General instructions Follow instructions from your doctor about what you cannot eat or drink. Plan to have someone take you home from the hospital or clinic. If you will be going home right after the procedure, plan to have someone with you for 24 hours. Ask your doctor what steps will be taken to prevent infection. What happens during the procedure?  An IV tube will be put into one of your veins. You will be given a  medicine to help you relax. The skin on your neck or groin will be numbed. A cut (incision) will be made in your neck or groin. A needle will be put through your cut and into a large vein. A tube (catheter) will be put into the needle. The tube will be moved to your heart. Dye may be put through the tube. This helps your doctor see your heart. Small devices (electrodes) on the tube will send out signals. A type of energy will be used to destroy some heart tissue. The tube will be taken out. Pressure will be held on your cut. This helps stop bleeding. A bandage will be put over your cut. The exact procedure may vary among doctors and hospitals. What happens after the procedure? You will be watched until you leave the hospital or clinic. This includes checking your heart rate, breathing rate, oxygen, and blood pressure. Your cut will be watched for bleeding. You will need to lie still for a few hours. Do not drive for 24 hours or as long as your doctor tells you. Summary Cardiac ablation is a procedure to destroy some heart tissue. This is done to treat heart rhythm problems. Tell your doctor about any medical conditions you may have. Tell him or her  about all medicines you are taking to treat them. This is a safe procedure. But problems may occur. These include infection, bruising, bleeding, and damage to nearby areas of your body. Follow what your doctor tells you about food and drink. You may also be told to change or stop some of your medicines. After the procedure, do not drive for 24 hours or as long as your doctor tells you. This information is not intended to replace advice given to you by your health care provider. Make sure you discuss any questions you have with your health care provider. Document Revised: 02/21/2022 Document Reviewed: 11/03/2019 Elsevier Patient Education  [place].

## 2022-11-26 ENCOUNTER — Other Ambulatory Visit: Payer: BC Managed Care – PPO

## 2022-11-26 ENCOUNTER — Telehealth: Payer: Self-pay | Admitting: Pharmacist

## 2022-11-26 NOTE — Telephone Encounter (Signed)
Medication list reviewed in anticipation of upcoming Tikosyn initiation. Patient is not taking any contraindicated or QTc prolonging medications.   Patient is anticoagulated on Xarelto '20mg'$  daily on the appropriate dose (CrCl 73). Please ensure that patient has not missed any anticoagulation doses in the 3 weeks prior to Tikosyn initiation.   Patient will need to be counseled to avoid use of Benadryl while on Tikosyn and in the 2-3 days prior to Tikosyn initiation.

## 2022-11-28 ENCOUNTER — Telehealth: Payer: Self-pay | Admitting: Cardiology

## 2022-11-28 NOTE — Telephone Encounter (Signed)
Mandy from [facility] states she needs pt recent EKG from 11/19/22 scanned into her chart to start her on tikosyn.

## 2022-11-28 NOTE — Telephone Encounter (Signed)
EKG has been scanned

## 2022-12-01 ENCOUNTER — Telehealth (HOSPITAL_COMMUNITY): Payer: Self-pay | Admitting: *Deleted

## 2022-12-01 NOTE — Telephone Encounter (Signed)
Patient approved for [facility] hospital admission on 12/29/22 reference number is [id number]

## 2022-12-11 ENCOUNTER — Ambulatory Visit: Payer: Self-pay | Admitting: Physician Assistant

## 2022-12-16 ENCOUNTER — Other Ambulatory Visit: Payer: BC Managed Care – PPO

## 2022-12-29 ENCOUNTER — Ambulatory Visit (HOSPITAL_COMMUNITY): Payer: BC Managed Care – PPO | Admitting: Physician Assistant

## 2023-01-02 ENCOUNTER — Telehealth (HOSPITAL_COMMUNITY): Payer: Self-pay

## 2023-01-02 NOTE — Telephone Encounter (Signed)
Contacted [employer]- Changed date of service for [place] hospital admission: 01/20/2023 Reference number [id number]

## 2023-01-05 ENCOUNTER — Other Ambulatory Visit: Payer: BC Managed Care – PPO

## 2023-01-05 ENCOUNTER — Telehealth (HOSPITAL_COMMUNITY): Payer: Self-pay | Admitting: *Deleted

## 2023-01-05 NOTE — Telephone Encounter (Signed)
Pt approved for tikosyn admission hospital stay on 2/6. Ref [id number]

## 2023-01-06 ENCOUNTER — Ambulatory Visit: Payer: BC Managed Care – PPO | Admitting: Nurse Practitioner

## 2023-01-08 NOTE — Progress Notes (Signed)
BP 124/82   Pulse 98   Temp 98.2 F (36.8 C) (Oral)   Resp 16   Ht 5' 3.75" (1.619 m)   Wt 150 lb 14.4 oz (68.4 kg)   LMP  (LMP Unknown)   SpO2 98%   BMI 26.11 kg/m    Subjective:    Patient ID: Kathy Holt, female    DOB: June 27, 1951, 72 y.o.   MRN: [id number]  HPI: Kathy Holt is a 73 y.o. female  Chief Complaint  Patient presents with   Sinusitis   Anxiety    Follow up    Sinusitis: she says she has had symptoms for about six weeks. She says that she has increased mucous and is causing soreness and pressure. She says it has been draining especially in the middle of the night.  She has done nasal wash, flonase, zyrtec. Recommend taking zyrtec, flonase, mucinex.  Will treat with Augmentin.   Anxiety/stress/insomnia: Reports that she cares for her husband who has had some vaginal cancer.  He had a balloon dilation every 2 weeks.  He then had an esophagectomy and had his vocal cords were damaged.  He is having trouble eating.  She says she has not been taking trazodone 25 mg at night to help her sleep, she says she has not needed it.  She says she will occasionally take Valium at night every once in awhile. Patient reports she is doing much better, her husband is doing better and she is back at work which has helped. She did try the buspar but did not like it and has not been taking.       01/09/2023    7:58 AM 10/06/2022    2:04 PM 10/06/2022   12:56 PM  Depression screen PHQ 2/9  Decreased Interest 0 0 0  Down, Depressed, Hopeless 0 0 0  PHQ - 2 Score 0 0 0  Altered sleeping 1 2 0  Tired, decreased energy 1 1 0  Change in appetite 0 0 0  Feeling bad or failure about yourself  0 0 0  Trouble concentrating 0 0 0  Moving slowly or fidgety/restless 0 0 0  Suicidal thoughts 0 0 0  PHQ-9 Score 2 3 0  Difficult doing work/chores Not difficult at all Not difficult at all Not difficult at all       01/09/2023    8:01 AM 10/06/2022    2:04 PM  GAD 7 :  Generalized Anxiety Score  Nervous, Anxious, on Edge 0 1  Control/stop worrying 0 1  Worry too much - different things 0 1  Trouble relaxing 0 1  Restless 0 0  Easily annoyed or irritable 0 0  Afraid - awful might happen 0 1  Total GAD 7 Score 0 5  Anxiety Difficulty Not difficult at all Not difficult at all    Atrial fibrillation: Diagnosed back in 2020.  She was getting ready to have a colonoscopy and they said that she was in A-fib.  She is currently taking Xarelto 20 mg daily and bisoprolol 2.5 mg daily.   She says it does come and go.  Visit we did refer her to cardiology.she was seen by cardiology on 10/09/2022. She saw Dr. Garen Lah. She says they are going to start her on Tikosyn but she has to be hospitalized for four days to start treatment.  she does have an ablation scheduled in March.  Echo 10/20/22  1. Left ventricular ejection fraction, by estimation, is 40  to 45%. Left  ventricular ejection fraction by 2D MOD biplane is 41.2 %. The left  ventricle has mildly decreased function. The left ventricle demonstrates  global hypokinesis. Left ventricular  diastolic function could not be evaluated.   2. Right ventricular systolic function is normal. The right ventricular  size is normal. There is normal pulmonary artery systolic pressure. The  estimated right ventricular systolic pressure is 62.7 mmHg.   3. Left atrial size was massively dilated.   4. Right atrial size was massively dilated.   5. The mitral valve is abnormal. Mild mitral valve regurgitation.   6. The aortic valve is tricuspid. Aortic valve regurgitation is not  visualized.   7. The inferior vena cava is normal in size with greater than 50%  respiratory variability, suggesting right atrial pressure of 3 mmHg.  Arthritis/Low back pain/DDD:  She says she did get injections in her back.   She says that she has not had to see anyone for her back in awhile.  She says she was seen at [facility] clinic. Dr. Candelaria Stagers  sports medicine. She tries to not aggravate her back. She reports she is doing okay.   Osteoporosis: She says that she was taking fosamax, but she was not feeling well when she took it.  She says her previous provider referred her to endocrinology but she was never seen.  She is currently taking calcium, and vitamin d 5000u daily.  Bone density done two years ago.  New bone density at last appointment.  Patient scheduled to have it done next month.  If no improvement will send to endocrinology. Bone density showed: Lumbar spine -3.4, hip -2.4, Frax 2.8 10-year risk of hip fracture   Relevant past medical, surgical, family and social history reviewed and updated as indicated. Interim medical history since our last visit reviewed. Allergies and medications reviewed and updated.  Review of Systems  Constitutional: Negative for fever or weight change.  Respiratory: Negative for cough and shortness of breath.   Cardiovascular: Negative for chest pain or palpitations.  Gastrointestinal: Negative for abdominal pain, no bowel changes.  Musculoskeletal: Negative for gait problem or joint swelling.  Skin: Negative for rash.  Neurological: Negative for dizziness or headache.  No other specific complaints in a complete review of systems (except as listed in HPI above).      Objective:    BP 124/82   Pulse 98   Temp 98.2 F (36.8 C) (Oral)   Resp 16   Ht 5' 3.75" (1.619 m)   Wt 150 lb 14.4 oz (68.4 kg)   LMP  (LMP Unknown)   SpO2 98%   BMI 26.11 kg/m   Wt Readings from Last 3 Encounters:  01/09/23 150 lb 14.4 oz (68.4 kg)  11/19/22 150 lb (68 kg)  10/30/22 148 lb (67.1 kg)    Physical Exam  Constitutional: Patient appears well-developed and well-nourished.  No distress.  HEENT: head atraumatic, normocephalic, pupils equal and reactive to light,  neck supple Cardiovascular: Normal rate, irregular rhythm and normal heart sounds.  No murmur heard. No BLE edema. Pulmonary/Chest: Effort  normal and breath sounds normal. No respiratory distress. Abdominal: Soft.  There is no tenderness. Psychiatric: Patient has a normal mood and affect. behavior is normal. Judgment and thought content normal.     Assessment & Plan:   Problem List Items Addressed This Visit       Cardiovascular and Mediastinum   Atrial fibrillation ([place]) - Primary    Continue follow-ups with cardiology.  Continue taking Xarelto 20 mg daily and bisoprolol 2.5 mg daily        Musculoskeletal and Integument   Arthritis    Low back pain patient states she has been doing well.      Age-related osteoporosis without current pathological fracture    Going to get DEXA scan done next month.  If no improvement will refer to endocrinology.        Other   Anxiety    She reports her stress has much improved since her husband is getting better and she is back at work.  She does occasionally take Valium which cannot shut her brain off.  She is no longer taking the trazodone.      Stress    She reports her stress has much improved since her husband is getting better and she is back at work.  She does occasionally take Valium which cannot shut her brain off.  She is no longer taking the trazodone.      RESOLVED: Back pain without sciatica   RESOLVED: Insomnia   Other Visit Diagnoses     Acute non-recurrent pansinusitis       Relevant Medications   amoxicillin-clavulanate (AUGMENTIN) 875-125 MG tablet        Follow up plan: Return in about 6 months (around 07/10/2023) for follow up.

## 2023-01-09 ENCOUNTER — Encounter: Payer: Self-pay | Admitting: Nurse Practitioner

## 2023-01-09 ENCOUNTER — Ambulatory Visit: Payer: BC Managed Care – PPO | Admitting: Nurse Practitioner

## 2023-01-09 ENCOUNTER — Other Ambulatory Visit: Payer: Self-pay

## 2023-01-09 VITALS — BP 124/82 | HR 98 | Temp 98.2°F | Resp 16 | Ht 63.75 in | Wt 150.9 lb

## 2023-01-09 DIAGNOSIS — J0140 Acute pansinusitis, unspecified: Secondary | ICD-10-CM

## 2023-01-09 DIAGNOSIS — I4891 Unspecified atrial fibrillation: Secondary | ICD-10-CM

## 2023-01-09 DIAGNOSIS — F5105 Insomnia due to other mental disorder: Secondary | ICD-10-CM | POA: Diagnosis not present

## 2023-01-09 DIAGNOSIS — M810 Age-related osteoporosis without current pathological fracture: Secondary | ICD-10-CM

## 2023-01-09 DIAGNOSIS — F419 Anxiety disorder, unspecified: Secondary | ICD-10-CM

## 2023-01-09 DIAGNOSIS — F439 Reaction to severe stress, unspecified: Secondary | ICD-10-CM

## 2023-01-09 DIAGNOSIS — M5489 Other dorsalgia: Secondary | ICD-10-CM

## 2023-01-09 DIAGNOSIS — F99 Mental disorder, not otherwise specified: Secondary | ICD-10-CM

## 2023-01-09 DIAGNOSIS — M549 Dorsalgia, unspecified: Secondary | ICD-10-CM

## 2023-01-09 DIAGNOSIS — M1990 Unspecified osteoarthritis, unspecified site: Secondary | ICD-10-CM

## 2023-01-09 MED ORDER — AMOXICILLIN-POT CLAVULANATE 875-125 MG PO TABS: 1.0000 | ORAL_TABLET | Freq: Two times a day (BID) | ORAL | 0 refills | Status: DC

## 2023-01-09 NOTE — Assessment & Plan Note (Signed)
Continue follow-ups with cardiology.  Continue taking Xarelto 20 mg daily and bisoprolol 2.5 mg daily

## 2023-01-09 NOTE — Assessment & Plan Note (Signed)
Going to get DEXA scan done next month.  If no improvement will refer to endocrinology.

## 2023-01-09 NOTE — Assessment & Plan Note (Signed)
Low back pain patient states she has been doing well.

## 2023-01-09 NOTE — Assessment & Plan Note (Signed)
She reports her stress has much improved since her husband is getting better and she is back at work.  She does occasionally take Valium which cannot shut her brain off.  She is no longer taking the trazodone.

## 2023-01-16 ENCOUNTER — Encounter (HOSPITAL_COMMUNITY): Payer: Self-pay

## 2023-01-20 ENCOUNTER — Ambulatory Visit (HOSPITAL_COMMUNITY)
Admission: RE | Admit: 2023-01-20 | Discharge: 2023-01-20 | Disposition: A | Discharge status: Home / Self Care | Payer: BC Managed Care – PPO | Source: Ambulatory Visit | Attending: Physician Assistant | Admitting: Physician Assistant

## 2023-01-20 ENCOUNTER — Other Ambulatory Visit: Payer: Self-pay

## 2023-01-20 ENCOUNTER — Encounter (HOSPITAL_COMMUNITY): Payer: Self-pay | Admitting: Cardiology

## 2023-01-20 ENCOUNTER — Encounter (HOSPITAL_COMMUNITY): Payer: Self-pay | Admitting: Physician Assistant

## 2023-01-20 ENCOUNTER — Inpatient Hospital Stay (HOSPITAL_COMMUNITY)
Admission: RE | Admit: 2023-01-20 | Discharge: 2023-01-23 | DRG: 310 | Disposition: A | Discharge status: Home / Self Care | Payer: BC Managed Care – PPO | Source: Ambulatory Visit | Attending: Cardiology | Admitting: Cardiology

## 2023-01-20 VITALS — BP 132/98 | HR 70 | Ht 63.75 in | Wt 156.0 lb

## 2023-01-20 DIAGNOSIS — Z7901 Long term (current) use of anticoagulants: Secondary | ICD-10-CM

## 2023-01-20 DIAGNOSIS — I4819 Other persistent atrial fibrillation: Principal | ICD-10-CM | POA: Diagnosis present

## 2023-01-20 DIAGNOSIS — Z79899 Other long term (current) drug therapy: Secondary | ICD-10-CM | POA: Diagnosis not present

## 2023-01-20 DIAGNOSIS — Z91040 Latex allergy status: Secondary | ICD-10-CM | POA: Diagnosis not present

## 2023-01-20 DIAGNOSIS — Z811 Family history of alcohol abuse and dependence: Secondary | ICD-10-CM | POA: Diagnosis not present

## 2023-01-20 DIAGNOSIS — Z801 Family history of malignant neoplasm of trachea, bronchus and lung: Secondary | ICD-10-CM

## 2023-01-20 DIAGNOSIS — Z800 Family history of malignant neoplasm of digestive organs: Secondary | ICD-10-CM | POA: Diagnosis not present

## 2023-01-20 LAB — BASIC METABOLIC PANEL
Anion gap: 9 (ref 5–15)
BUN: 13 mg/dL (ref 8–23)
CO2: 25 mmol/L (ref 22–32)
Calcium: 9.2 mg/dL (ref 8.9–10.3)
Chloride: 105 mmol/L (ref 98–111)
Creatinine, Ser: 0.82 mg/dL (ref 0.44–1.00)
GFR, Estimated: >60 mL/min (ref >60)
Glucose, Bld: 89 mg/dL (ref 70–99)
Potassium: 3.8 mmol/L (ref 3.5–5.1)
Sodium: 139 mmol/L (ref 135–145)

## 2023-01-20 LAB — MAGNESIUM: Magnesium: 2.2 mg/dL (ref 1.7–2.4)

## 2023-01-20 MED ORDER — SODIUM CHLORIDE 0.9% FLUSH
3.0000 mL | Freq: Two times a day (BID) | INTRAVENOUS | Status: DC
  Administered 2023-01-20 – 2023-01-23 (×6): 3 mL via INTRAVENOUS

## 2023-01-20 MED ORDER — BISOPROLOL FUMARATE 5 MG PO TABS
2.5000 mg | ORAL_TABLET | Freq: Every day | ORAL | Status: DC
  Administered 2023-01-21: 2.5 mg via ORAL
  Filled 2023-01-20 (×2): qty 1

## 2023-01-20 MED ORDER — DOFETILIDE 500 MCG PO CAPS
500.0000 ug | ORAL_CAPSULE | Freq: Two times a day (BID) | ORAL | Status: DC
  Administered 2023-01-20 – 2023-01-23 (×6): 500 ug via ORAL
  Filled 2023-01-20 (×6): qty 1

## 2023-01-20 MED ORDER — RIVAROXABAN 20 MG PO TABS
20.0000 mg | ORAL_TABLET | Freq: Every day | ORAL | Status: DC
  Administered 2023-01-20 – 2023-01-22 (×3): 20 mg via ORAL
  Filled 2023-01-20 (×5): qty 1

## 2023-01-20 MED ORDER — MAGNESIUM OXIDE -MG SUPPLEMENT 400 (240 MG) MG PO TABS
400.0000 mg | ORAL_TABLET | Freq: Every day | ORAL | Status: DC
  Administered 2023-01-20 – 2023-01-22 (×3): 400 mg via ORAL
  Filled 2023-01-20 (×3): qty 1

## 2023-01-20 MED ORDER — SODIUM CHLORIDE 0.9% FLUSH: 3.0000 mL | INTRAVENOUS | Status: DC | PRN

## 2023-01-20 MED ORDER — ACETAMINOPHEN 325 MG PO TABS
650.0000 mg | ORAL_TABLET | Freq: Four times a day (QID) | ORAL | Status: DC | PRN
  Administered 2023-01-20 – 2023-01-22 (×4): 650 mg via ORAL
  Filled 2023-01-20 (×4): qty 2

## 2023-01-20 MED ORDER — SODIUM CHLORIDE 0.9 % IV SOLN: 250.0000 mL | INTRAVENOUS | Status: DC | PRN

## 2023-01-20 MED ORDER — POTASSIUM CHLORIDE CRYS ER 20 MEQ PO TBCR
40.0000 meq | EXTENDED_RELEASE_TABLET | Freq: Once | ORAL | Status: AC
  Administered 2023-01-20: 40 meq via ORAL

## 2023-01-20 MED ORDER — BISOPROLOL FUMARATE 5 MG PO TABS: 2.5000 mg | ORAL_TABLET | Freq: Every day | ORAL | Status: DC

## 2023-01-20 NOTE — Care Management (Signed)
  Transition of Care [facility]) Screening Note   Patient Details  Name: Kathy Holt Date of Birth: 05/23/1951   Transition of Care [facility]) CM/SW Contact:    Bethena Roys, RN Phone Number: 01/20/2023, 4:15 PM   Transition of Care Department [facility]) has reviewed the patient. Patient presented for Tikosyn Load. Benefits check submitted for cost. Case Manager will discuss cost and pharmacy of choice as the patient progresses.

## 2023-01-20 NOTE — Progress Notes (Signed)
Pharmacy: Dofetilide (Tikosyn) - Initial Consult Assessment and Electrolyte Replacement  Pharmacy consulted to assist in monitoring and replacing electrolytes in this 72 y.o. female admitted on 01/20/2023 undergoing dofetilide initiation. First dofetilide dose: '500mg'$   Assessment:  Patient Exclusion Criteria: If any screening criteria checked as "Yes", then  patient  should NOT receive dofetilide until criteria item is corrected.  If "Yes" please indicate correction plan.  YES  NO Patient  Exclusion Criteria Correction Plan   '[]'$   '[x]'$   Baseline QTc interval is greater than or equal to 440 msec. IF above YES box checked dofetilide contraindicated unless patient has ICD; then may proceed if QTc 500-550 msec or with known ventricular conduction abnormalities may proceed with QTc 550-600 msec. QTc =      '[]'$   '[x]'$   Patient is known or suspected to have a digoxin level greater than 2 ng/ml: No results found for: "DIGOXIN"     '[]'$   '[x]'$   Creatinine clearance less than 20 ml/min (calculated using Cockcroft-Gault, actual body weight and serum creatinine): Estimated Creatinine Clearance: 59.7 mL/min (by C-G formula based on SCr of 0.82 mg/dL).     '[]'$   '[x]'$  Patient has received drugs known to prolong the QT intervals within the last 48 hours (phenothiazines, tricyclics or tetracyclic antidepressants, erythromycin, H-1 antihistamines, cisapride, fluoroquinolones, azithromycin, ondansetron).   Updated information on QT prolonging agents is available to be searched on the following database:QT prolonging agents     '[]'$   '[x]'$   Patient received a dose of hydrochlorothiazide (Oretic) alone or in any combination including triamterene (Dyazide, Maxzide) in the last 48 hours.    '[]'$   '[x]'$  Patient received a medication known to increase dofetilide plasma concentrations prior to initial dofetilide dose:  Trimethoprim (Primsol, Proloprim) in the last 36 hours Verapamil (Calan, Verelan) in the last 36 hours or  a sustained release dose in the last 72 hours Megestrol (Megace) in the last 5 days  Cimetidine (Tagamet) in the last 6 hours Ketoconazole (Nizoral) in the last 24 hours Itraconazole (Sporanox) in the last 48 hours  Prochlorperazine (Compazine) in the last 36 hours     '[]'$   '[x]'$   Patient is known to have a history of torsades de pointes; congenital or acquired long QT syndromes.    '[]'$   '[x]'$   Patient has received a Class 1 antiarrhythmic with less than 2 half-lives since last dose. (Disopyramide, Quinidine, Procainamide, Lidocaine, Mexiletine, Flecainide, Propafenone)    '[]'$   '[x]'$   Patient has received amiodarone therapy in the past 3 months or amiodarone level is greater than 0.3 ng/ml.    Labs:    Component Value Date/Time   K 3.8 01/20/2023 1104   MG 2.2 01/20/2023 1104     Plan: Select One Calculated CrCl  Dose q12h  '[x]'$  > 60 ml/min 500 mcg  '[]'$  40-60 ml/min 250 mcg  '[]'$  20-40 ml/min 125 mcg   '[x]'$   Physician selected initial dose within range recommended for patients level of renal function - will monitor for response.  '[]'$   Physician selected initial dose outside of range recommended for patients level of renal function - will discuss if the dose should be altered at this time.   Patient has been appropriately anticoagulated with Xarelto '20mg'$ .  Potassium: K 3.8-3.9:  Hold Tikosyn initiation and give KCl 40 mEq po x1 then begin Tikosyn at least 2hr after KCl dose - do not need to recheck K   Magnesium: Mg >2: Appropriate to initiate Tikosyn, no replacement needed  Thank you for allowing pharmacy to participate in this patient's care   Merrilee Jansky, PharmD Clinical Pharmacist 01/20/2023  3:01 PM

## 2023-01-20 NOTE — H&P (Signed)
Primary Care Physician: Bo Merino, FNP Primary Cardiologist: Dr Garen Lah Primary Electrophysiologist: Dr Quentin Ore Referring Physician: Dr Areatha Keas is a 72 y.o. female with a history of atrial fibrillation who presents for follow up in the [facility].  The patient was initially diagnosed with atrial fibrillation in 2019 after presenting for a colonoscopy. Patient is on Xarelto for a CHADS2VASC score of 2. She has continued to have paroxysms of afib since. She underwent DCCV on 10/30/22 but had quick return of afib. She was seen by Dr Quentin Ore who recommended dofetilide as a bridge to ablation.   On follow up today, patient reports that she feels reasonably well. She does not have the tachypalpitations that she did when her afib was first diagnosed. She denies any bleeding issues on anticoagulation.   Today, she denies symptoms of chest pain, shortness of breath, orthopnea, PND, lower extremity edema, dizziness, presyncope, syncope, snoring, daytime somnolence, bleeding, or neurologic sequela. The patient is tolerating medications without difficulties and is otherwise without complaint today.    Atrial Fibrillation Risk Factors:  she does not have symptoms or diagnosis of sleep apnea. she does not have a history of rheumatic fever.   she has a BMI of Body mass index is 27.1 kg/m.Marland Kitchen Filed Weights   01/20/23 1452  Weight: 69.4 kg    Family History  Problem Relation Age of Onset   Cancer - Lung Mother    Pancreatic cancer Father    Alcoholism Brother      Atrial Fibrillation Management history:  Previous antiarrhythmic drugs: none Previous cardioversions: 10/30/22 Previous ablations: none CHADS2VASC score: 2 Anticoagulation history: Xarelto   Past Medical History:  Diagnosis Date   Arthritis    lower back   DDD (degenerative disc disease), cervical    Dysrhythmia    Afib   Past Surgical History:  Procedure  Laterality Date   ABDOMINAL HYSTERECTOMY     partial   BLADDER SUSPENSION  1998   CARDIOVERSION N/A 10/30/2022   Procedure: CARDIOVERSION;  Surgeon: Minna Merritts, MD;  Location: [facility] ORS;  Service: Cardiovascular;  Laterality: N/A;   COLONOSCOPY     KNEE ARTHROSCOPY WITH MEDIAL MENISECTOMY Left 01/30/2021   Procedure: KNEE ARTHROSCOPY WITH PARTIAL MEDIAL/LATERAL MENISECTOMY, PARTIAL SYNOVECTOMY;  Surgeon: Lovell Sheehan, MD;  Location: [facility] ORS;  Service: Orthopedics;  Laterality: Left;    Current Facility-Administered Medications  Medication Dose Route Frequency Provider Last Rate Last Admin   0.9 %  sodium chloride infusion  250 mL Intravenous PRN Fenton, Clint R, PA       [START ON 01/21/2023] bisoprolol (ZEBETA) tablet 2.5 mg  2.5 mg Oral Daily Fenton, Clint R, PA       dofetilide (TIKOSYN) capsule 500 mcg  500 mcg Oral BID Fenton, Clint R, PA       magnesium oxide (MAG-OX) tablet 400 mg  400 mg Oral QHS Fenton, Clint R, PA       rivaroxaban (XARELTO) tablet 20 mg  20 mg Oral Q supper Fenton, Clint R, PA   20 mg at 01/20/23 1639   sodium chloride flush (NS) 0.9 % injection 3 mL  3 mL Intravenous Q12H Fenton, Clint R, PA   3 mL at 01/20/23 1641   sodium chloride flush (NS) 0.9 % injection 3 mL  3 mL Intravenous PRN Fenton, Clint R, PA        Allergies  Allergen Reactions   Latex Rash  Contact dermatitis    Social History   Socioeconomic History   Marital status: Married    Spouse name: andrew   Number of children: Not on file   Years of education: Not on file   Highest education level: Not on file  Occupational History   Occupation: run Technical sales engineer. at [employer]    Comment: full time  Tobacco Use   Smoking status: Never   Smokeless tobacco: Never   Tobacco comments:    Never smoke 01/20/23  Vaping Use   Vaping Use: Never used  Substance and Sexual Activity   Alcohol use: Never    Comment: not in 20 years   Drug use: Never   Sexual activity: Not  Currently  Other Topics Concern   Not on file  Social History Narrative   Patient lives with husband and feels safe in her home.  She still works full time.   Social Determinants of Health   Financial Resource Strain: Low Risk  (10/06/2022)   Overall Financial Resource Strain (CARDIA)    Difficulty of Paying Living Expenses: Not hard at all  Food Insecurity: No Food Insecurity (01/20/2023)   Hunger Vital Sign    Worried About Running Out of Food in the Last Year: Never true    Ran Out of Food in the Last Year: Never true  Transportation Needs: No Transportation Needs (01/20/2023)   PRAPARE - Hydrologist (Medical): No    Lack of Transportation (Non-Medical): No  Physical Activity: Sufficiently Active (10/06/2022)   Exercise Vital Sign    Days of Exercise per Week: 5 days    Minutes of Exercise per Session: 30 min  Stress: Stress Concern Present (10/06/2022)   [place]    Feeling of Stress : Very much  Social Connections: Moderately Isolated (10/06/2022)   Social Connection and Isolation Panel [NHANES]    Frequency of Communication with Friends and Family: Three times a week    Frequency of Social Gatherings with Friends and Family: Three times a week    Attends Religious Services: Never    Active Member of Clubs or Organizations: No    Attends Archivist Meetings: Never    Marital Status: Married  Human resources officer Violence: Not At Risk (01/20/2023)   Humiliation, Afraid, Rape, and Kick questionnaire    Fear of Current or Ex-Partner: No    Emotionally Abused: No    Physically Abused: No    Sexually Abused: No     ROS- All systems are reviewed and negative except as per the HPI above.  Physical Exam: Vitals:   01/20/23 1452 01/20/23 1506  BP:  130/84  Pulse:  74  Resp:  16  Temp:  (!) 97.2 F (36.2 C)  TempSrc:  Oral  SpO2:  98%  Weight: 69.4 kg   Height: '5\' 3"'$   (1.6 m)     GEN- The patient is a well appearing female, alert and oriented x 3 today.   Head- normocephalic, atraumatic Eyes-  Sclera clear, conjunctiva pink Ears- hearing intact Oropharynx- clear Neck- supple  Lungs- Clear to ausculation bilaterally, normal work of breathing Heart- irregular rate and rhythm, no murmurs, rubs or gallops  GI- soft, NT, ND, + BS Extremities- no clubbing, cyanosis, or edema MS- no significant deformity or atrophy Skin- no rash or lesion Psych- euthymic mood, full affect Neuro- strength and sensation are intact  Wt Readings from Last 3 Encounters:  01/20/23 69.4 kg  01/20/23 70.8 kg  01/09/23 68.4 kg    EKG today demonstrates  Afib Vent. rate 70 BPM PR interval * ms QRS duration 88 ms QT/QTcB 390/421 ms  Echo 10/20/22 demonstrated   1. Left ventricular ejection fraction, by estimation, is 40 to 45%. (50% per Dr Garen Lah review) Left ventricular ejection fraction by 2D MOD biplane is 41.2 %. The left ventricle has mildly decreased function. The left ventricle demonstrates global hypokinesis. Left ventricular diastolic function could not be evaluated.   2. Right ventricular systolic function is normal. The right ventricular  size is normal. There is normal pulmonary artery systolic pressure. The  estimated right ventricular systolic pressure is 81.1 mmHg.   3. Left atrial size was massively dilated.   4. Right atrial size was massively dilated.   5. The mitral valve is abnormal. Mild mitral valve regurgitation.   6. The aortic valve is tricuspid. Aortic valve regurgitation is not  visualized.   7. The inferior vena cava is normal in size with greater than 50%  respiratory variability, suggesting right atrial pressure of 3 mmHg.   Comparison(s): No prior Echocardiogram.   Epic records are reviewed at length today  CHA2DS2-VASc Score = 2  The patient's score is based upon: CHF History: 0 HTN History: 0 Diabetes History: 0 Stroke  History: 0 Vascular Disease History: 0 Age Score: 1 Gender Score: 1       ASSESSMENT AND PLAN: 1. Persistent Atrial Fibrillation (ICD10:  I48.19) The patient's CHA2DS2-VASc score is 2, indicating a 2.2% annual risk of stroke.   Patient presents for dofetilide admission. Continue Xarelto 20 mg daily, states no missed doses in the last 3 weeks. No recent benadryl use PharmD has screened medications QTc in SR 408 ms Labs today show creatinine at 0.82, K+ 3.8 and mag 2.2, CrCl calculated at 71 mL/min Continue bisoprolol 2.5 mg daily Patient is scheduled for afib ablation 03/02/23   To be admitted later today once a bed becomes available.    [facility] [place], [place] [address] [phone] 01/20/2023 5:26 PM  I have seen and examined this patient with Adline Peals.  Agree with above, note added to reflect my findings.  With a history of persistent atrial fibrillation.  Scheduled for ablation.  Presents today for dofetilide load.  GEN: Well nourished, well developed, in no acute distress  HEENT: normal  Neck: no JVD, carotid bruits, or masses Cardiac: Irregular ; no murmurs, rubs, or gallops,no edema  Respiratory:  clear to auscultation bilaterally, normal work of breathing GI: soft, nontender, nondistended, + BS MS: no deformity or atrophy  Skin: warm and dry Neuro:  Strength and sensation are intact Psych: euthymic mood, full affect   Persistent atrial fibrillation: Improving for dofetilide load.  Continue Xarelto.  CHA2DS2-VASc of 2.  Bana Borgmeyer monitor potassium magnesium throughout hospital visit.  Plan to initiate with 500 mcg twice daily.  Plan for cardioversion Thursday if she does not convert to sinus rhythm.  Elden Brucato M. Aneira Cavitt MD 01/20/2023 5:26 PM

## 2023-01-20 NOTE — Progress Notes (Signed)
Primary Care Physician: Bo Merino, FNP Primary Cardiologist: Dr Garen Lah Primary Electrophysiologist: Dr Quentin Ore Referring Physician: Dr Areatha Keas is a 72 y.o. female with a history of atrial fibrillation who presents for follow up in the [facility].  The patient was initially diagnosed with atrial fibrillation in 2019 after presenting for a colonoscopy. Patient is on Xarelto for a CHADS2VASC score of 2. She has continued to have paroxysms of afib since. She underwent DCCV on 10/30/22 but had quick return of afib. She was seen by Dr Quentin Ore who recommended dofetilide as a bridge to ablation.   On follow up today, patient reports that she feels reasonably well. She does not have the tachypalpitations that she did when her afib was first diagnosed. She denies any bleeding issues on anticoagulation.   Today, she denies symptoms of chest pain, shortness of breath, orthopnea, PND, lower extremity edema, dizziness, presyncope, syncope, snoring, daytime somnolence, bleeding, or neurologic sequela. The patient is tolerating medications without difficulties and is otherwise without complaint today.    Atrial Fibrillation Risk Factors:  she does not have symptoms or diagnosis of sleep apnea. she does not have a history of rheumatic fever.   she has a BMI of Body mass index is 26.99 kg/m.Marland Kitchen Filed Weights   01/20/23 1105  Weight: 70.8 kg    Family History  Problem Relation Age of Onset   Cancer - Lung Mother    Pancreatic cancer Father    Alcoholism Brother      Atrial Fibrillation Management history:  Previous antiarrhythmic drugs: none Previous cardioversions: 10/30/22 Previous ablations: none CHADS2VASC score: 2 Anticoagulation history: Xarelto   Past Medical History:  Diagnosis Date   Arthritis    lower back   DDD (degenerative disc disease), cervical    Dysrhythmia    Afib   Past Surgical History:  Procedure  Laterality Date   ABDOMINAL HYSTERECTOMY     partial   BLADDER SUSPENSION  1998   CARDIOVERSION N/A 10/30/2022   Procedure: CARDIOVERSION;  Surgeon: Minna Merritts, MD;  Location: [facility] ORS;  Service: Cardiovascular;  Laterality: N/A;   COLONOSCOPY     KNEE ARTHROSCOPY WITH MEDIAL MENISECTOMY Left 01/30/2021   Procedure: KNEE ARTHROSCOPY WITH PARTIAL MEDIAL/LATERAL MENISECTOMY, PARTIAL SYNOVECTOMY;  Surgeon: Lovell Sheehan, MD;  Location: [facility] ORS;  Service: Orthopedics;  Laterality: Left;    Current Outpatient Medications  Medication Sig Dispense Refill   bisoprolol (ZEBETA) 5 MG tablet Take 1 tablet (5 mg total) by mouth daily. (Patient taking differently: Take 2.5 mg by mouth daily.) 30 tablet 3   MAGNESIUM GLYCINATE PO Take 400 mg by mouth at bedtime.     OVER THE COUNTER MEDICATION Take 1 tablet by mouth daily. Calcium Algae     rivaroxaban (XARELTO) 20 MG TABS tablet Take 20 mg by mouth daily with supper.     No current facility-administered medications for this encounter.    Allergies  Allergen Reactions   Latex Rash    Contact dermatitis    Social History   Socioeconomic History   Marital status: Married    Spouse name: andrew   Number of children: Not on file   Years of education: Not on file   Highest education level: Not on file  Occupational History   Occupation: run Technical sales engineer. at [employer]    Comment: full time  Tobacco Use   Smoking status: Never   Smokeless tobacco: Never   Tobacco  comments:    Never smoke 01/20/23  Vaping Use   Vaping Use: Never used  Substance and Sexual Activity   Alcohol use: Never    Comment: not in 20 years   Drug use: Never   Sexual activity: Not Currently  Other Topics Concern   Not on file  Social History Narrative   Patient lives with husband and feels safe in her home.  She still works full time.   Social Determinants of Health   Financial Resource Strain: Low Risk  (10/06/2022)   Overall Financial  Resource Strain (CARDIA)    Difficulty of Paying Living Expenses: Not hard at all  Food Insecurity: No Food Insecurity (10/06/2022)   Hunger Vital Sign    Worried About Running Out of Food in the Last Year: Never true    Ran Out of Food in the Last Year: Never true  Transportation Needs: No Transportation Needs (10/06/2022)   PRAPARE - Hydrologist (Medical): No    Lack of Transportation (Non-Medical): No  Physical Activity: Sufficiently Active (10/06/2022)   Exercise Vital Sign    Days of Exercise per Week: 5 days    Minutes of Exercise per Session: 30 min  Stress: Stress Concern Present (10/06/2022)   [place]    Feeling of Stress : Very much  Social Connections: Moderately Isolated (10/06/2022)   Social Connection and Isolation Panel [NHANES]    Frequency of Communication with Friends and Family: Three times a week    Frequency of Social Gatherings with Friends and Family: Three times a week    Attends Religious Services: Never    Active Member of Clubs or Organizations: No    Attends Archivist Meetings: Never    Marital Status: Married  Human resources officer Violence: Not At Risk (10/06/2022)   Humiliation, Afraid, Rape, and Kick questionnaire    Fear of Current or Ex-Partner: No    Emotionally Abused: No    Physically Abused: No    Sexually Abused: No     ROS- All systems are reviewed and negative except as per the HPI above.  Physical Exam: Vitals:   01/20/23 1105  BP: (!) 132/98  Pulse: 70  Weight: 70.8 kg  Height: 5' 3.75" (1.619 m)    GEN- The patient is a well appearing female, alert and oriented x 3 today.   Head- normocephalic, atraumatic Eyes-  Sclera clear, conjunctiva pink Ears- hearing intact Oropharynx- clear Neck- supple  Lungs- Clear to ausculation bilaterally, normal work of breathing Heart- irregular rate and rhythm, no murmurs, rubs or gallops   GI- soft, NT, ND, + BS Extremities- no clubbing, cyanosis, or edema MS- no significant deformity or atrophy Skin- no rash or lesion Psych- euthymic mood, full affect Neuro- strength and sensation are intact  Wt Readings from Last 3 Encounters:  01/20/23 70.8 kg  01/09/23 68.4 kg  11/19/22 68 kg    EKG today demonstrates  Afib Vent. rate 70 BPM PR interval * ms QRS duration 88 ms QT/QTcB 390/421 ms  Echo 10/20/22 demonstrated   1. Left ventricular ejection fraction, by estimation, is 40 to 45%. (50% per Dr Garen Lah review) Left ventricular ejection fraction by 2D MOD biplane is 41.2 %. The left ventricle has mildly decreased function. The left ventricle demonstrates global hypokinesis. Left ventricular diastolic function could not be evaluated.   2. Right ventricular systolic function is normal. The right ventricular  size is normal. There  is normal pulmonary artery systolic pressure. The  estimated right ventricular systolic pressure is 35.4 mmHg.   3. Left atrial size was massively dilated.   4. Right atrial size was massively dilated.   5. The mitral valve is abnormal. Mild mitral valve regurgitation.   6. The aortic valve is tricuspid. Aortic valve regurgitation is not  visualized.   7. The inferior vena cava is normal in size with greater than 50%  respiratory variability, suggesting right atrial pressure of 3 mmHg.   Comparison(s): No prior Echocardiogram.   Epic records are reviewed at length today  CHA2DS2-VASc Score = 2  The patient's score is based upon: CHF History: 0 HTN History: 0 Diabetes History: 0 Stroke History: 0 Vascular Disease History: 0 Age Score: 1 Gender Score: 1       ASSESSMENT AND PLAN: 1. Persistent Atrial Fibrillation (ICD10:  I48.19) The patient's CHA2DS2-VASc score is 2, indicating a 2.2% annual risk of stroke.   Patient presents for dofetilide admission. Continue Xarelto 20 mg daily, states no missed doses in the last 3  weeks. No recent benadryl use PharmD has screened medications QTc in SR 408 ms Labs today show creatinine at 0.82, K+ 3.8 and mag 2.2, CrCl calculated at 71 mL/min Continue bisoprolol 2.5 mg daily Patient is scheduled for afib ablation 03/02/23   To be admitted later today once a bed becomes available.    [facility] [address] [place], [place] [address] [phone] 01/20/2023 11:26 AM

## 2023-01-21 ENCOUNTER — Other Ambulatory Visit (HOSPITAL_COMMUNITY): Payer: Self-pay

## 2023-01-21 LAB — BASIC METABOLIC PANEL
Anion gap: 7 (ref 5–15)
BUN: 14 mg/dL (ref 8–23)
CO2: 24 mmol/L (ref 22–32)
Calcium: 8.8 mg/dL — ABNORMAL LOW (ref 8.9–10.3)
Chloride: 108 mmol/L (ref 98–111)
Creatinine, Ser: 0.73 mg/dL (ref 0.44–1.00)
GFR, Estimated: >60 mL/min (ref >60)
Glucose, Bld: 99 mg/dL (ref 70–99)
Potassium: 4 mmol/L (ref 3.5–5.1)
Sodium: 139 mmol/L (ref 135–145)

## 2023-01-21 LAB — MAGNESIUM: Magnesium: 2.3 mg/dL (ref 1.7–2.4)

## 2023-01-21 LAB — HIV ANTIBODY (ROUTINE TESTING W REFLEX): HIV Screen 4th Generation wRfx: NONREACTIVE

## 2023-01-21 NOTE — Progress Notes (Signed)
Rounding Note    Patient Name: Kathy Holt Date of Encounter: 01/21/2023  [facility] Cardiologist: Dr. Garen Lah  Subjective   No complaints  Inpatient Medications    Scheduled Meds:  bisoprolol  2.5 mg Oral Daily   dofetilide  500 mcg Oral BID   magnesium oxide  400 mg Oral QHS   rivaroxaban  20 mg Oral Q supper   sodium chloride flush  3 mL Intravenous Q12H   Continuous Infusions:  sodium chloride     PRN Meds: sodium chloride, acetaminophen, sodium chloride flush   Vital Signs    Vitals:   01/20/23 1452 01/20/23 1506 01/20/23 2000  BP:  130/84 111/76  Pulse:  74 63  Resp:  16 16  Temp:  (!) 97.2 F (36.2 C) (!) 97.3 F (36.3 C)  TempSrc:  Oral Oral  SpO2:  98% 98%  Weight: 69.4 kg    Height: '5\' 3"'$  (1.6 m)     No intake or output data in the 24 hours ending 01/21/23 0706    01/20/2023    2:52 PM 01/20/2023   11:05 AM 01/09/2023    7:49 AM  Last 3 Weights  Weight (lbs) 153 lb 156 lb 150 lb 14.4 oz  Weight (kg) 69.4 kg 70.761 kg 68.448 kg      Telemetry    AFib mostly 60's-90's - Personally Reviewed  ECG    AFib 75bpm, QTc 413m - Personally Reviewed with Dr. LQuentin Ore Physical Exam   GEN: No acute distress.   Neck: No JVD Cardiac: irreg-irreg, no murmurs, rubs, or gallops.  Respiratory: CTA b/l. GI: Soft, nontender, non-distended  MS: No edema; No deformity. Neuro:  Nonfocal  Psych: Normal affect   Labs    High Sensitivity Troponin:  No results for input(s): "TROPONINIHS" in the last 720 hours.   Chemistry Recent Labs  Lab 01/20/23 1104 01/21/23 0247  NA 139 139  K 3.8 4.0  CL 105 108  CO2 25 24  GLUCOSE 89 99  BUN 13 14  CREATININE 0.82 0.73  CALCIUM 9.2 8.8*  MG 2.2 2.3  GFRNONAA >60 >60  ANIONGAP 9 7    Lipids No results for input(s): "CHOL", "TRIG", "HDL", "LABVLDL", "LDLCALC", "CHOLHDL" in the last 168 hours.  HematologyNo results for input(s): "WBC", "RBC", "HGB", "HCT", "MCV", "MCH", "MCHC", "RDW",  "PLT" in the last 168 hours. Thyroid No results for input(s): "TSH", "FREET4" in the last 168 hours.  BNPNo results for input(s): "BNP", "PROBNP" in the last 168 hours.  DDimer No results for input(s): "DDIMER" in the last 168 hours.   Radiology    No results found.  Cardiac Studies   Echo 10/20/22    1. Left ventricular ejection fraction, by estimation, is 40 to 45%. (50% per Dr AGaren Lahreview) Left ventricular ejection fraction by 2D MOD biplane is 41.2 %. The left ventricle has mildly decreased function. The left ventricle demonstrates global hypokinesis. Left ventricular diastolic function could not be evaluated.   2. Right ventricular systolic function is normal. The right ventricular  size is normal. There is normal pulmonary artery systolic pressure. The  estimated right ventricular systolic pressure is 254.0mmHg.   3. Left atrial size was massively dilated.   4. Right atrial size was massively dilated.   5. The mitral valve is abnormal. Mild mitral valve regurgitation.   6. The aortic valve is tricuspid. Aortic valve regurgitation is not  visualized.   7. The inferior vena cava is normal  in size with greater than 50%  respiratory variability, suggesting right atrial pressure of 3 mmHg.   Patient Profile     72 y.o. female w/PMHx of AFib admitted for [place]    Persistent AFib CHA2DS2Vasc is 2, on Xareto Tikosyn load is in progress K+ 4.0 Mag 2.3 Creat 0.73 (stable) QTc is stable  DCCV tomorrow if not in SR, pt agreeable   For questions or updates, please contact [place] Please consult [email or web address] for contact info under        Signed, Baldwin [place], PA-C  01/21/2023, 7:06 AM

## 2023-01-21 NOTE — Progress Notes (Signed)
Pharmacy: Dofetilide (Tikosyn) - Follow Up Assessment and Electrolyte Replacement  Pharmacy consulted to assist in monitoring and replacing electrolytes in this 72 y.o. female admitted on 01/20/2023 undergoing dofetilide initiation. First dofetilide dose: 500 mcg @ 2/6 8 pm  Labs:    Component Value Date/Time   K 4.0 01/21/2023 0247   MG 2.3 01/21/2023 0247     Plan: Potassium: K >/= 4: No additional supplementation needed  Magnesium: Mg > 2: No additional supplementation needed     Thank you for allowing pharmacy to participate in this patient's care   Marguerite Olea, [facility] Clinical Pharmacist  01/21/2023 7:25 AM   [facility] pharmacy phone numbers are listed on [email or web address]

## 2023-01-21 NOTE — TOC Benefit Eligibility Note (Signed)
Patient Teacher, English as a foreign language completed.    The patient is currently admitted and upon discharge could be taking dofetlide (Tikosyn) 500 mcg capsules.  The current 30 day co-pay is $100.00.   The patient is insured through [employer], [place] Patient [place] Patient Advocate Team Direct Number: [phone]  Fax: [phone]

## 2023-01-21 NOTE — Progress Notes (Signed)
Post dose EKG is reviewed She has converted to SB QTc is stable Continue Tikosyn  Tommye Standard, PA-C

## 2023-01-22 ENCOUNTER — Encounter (HOSPITAL_COMMUNITY)
Admission: RE | Disposition: A | Discharge status: Home / Self Care | Payer: Self-pay | Source: Ambulatory Visit | Attending: Cardiology

## 2023-01-22 LAB — MAGNESIUM: Magnesium: 2.4 mg/dL (ref 1.7–2.4)

## 2023-01-22 LAB — BASIC METABOLIC PANEL
Anion gap: 10 (ref 5–15)
BUN: 19 mg/dL (ref 8–23)
CO2: 24 mmol/L (ref 22–32)
Calcium: 8.9 mg/dL (ref 8.9–10.3)
Chloride: 102 mmol/L (ref 98–111)
Creatinine, Ser: 0.75 mg/dL (ref 0.44–1.00)
GFR, Estimated: >60 mL/min (ref >60)
Glucose, Bld: 100 mg/dL — ABNORMAL HIGH (ref 70–99)
Potassium: 4 mmol/L (ref 3.5–5.1)
Sodium: 136 mmol/L (ref 135–145)

## 2023-01-22 SURGERY — CARDIOVERSION
Anesthesia: General

## 2023-01-22 MED ORDER — BISOPROLOL FUMARATE 5 MG PO TABS: 2.5000 mg | ORAL_TABLET | Freq: Every day | ORAL | Status: DC

## 2023-01-22 MED ORDER — BISOPROLOL FUMARATE 5 MG PO TABS
2.5000 mg | ORAL_TABLET | Freq: Every day | ORAL | Status: DC
  Administered 2023-01-22 – 2023-01-23 (×2): 2.5 mg via ORAL
  Filled 2023-01-22: qty 1

## 2023-01-22 NOTE — Progress Notes (Signed)
Pharmacy: Dofetilide (Tikosyn) - Follow Up Assessment and Electrolyte Replacement  Pharmacy consulted to assist in monitoring and replacing electrolytes in this 72 y.o. female admitted on 01/20/2023 undergoing dofetilide initiation. First dofetilide dose: 500 mcg @ 2/6 8 pm  Labs:    Component Value Date/Time   K 4.0 01/22/2023 0143   MG 2.4 01/22/2023 0143     Plan: Potassium: K >/= 4: No additional supplementation needed  Magnesium: Mg > 2: No additional supplementation needed   Thank you for allowing pharmacy to participate in this patient's care   Antonietta Jewel, PharmD, [place] Pharmacist  Phone: [phone] 01/22/2023 7:25 AM  Please check AMION for all [place] phone numbers After 10:00 PM, call [place] [phone]

## 2023-01-22 NOTE — Progress Notes (Signed)
Rounding Note    Patient Name: Kathy Holt Date of Encounter: 01/22/2023  [facility] Cardiologist: Dr. Garen Lah  Subjective   No complaints  Inpatient Medications    Scheduled Meds:  bisoprolol  2.5 mg Oral Daily   dofetilide  500 mcg Oral BID   magnesium oxide  400 mg Oral QHS   rivaroxaban  20 mg Oral Q supper   sodium chloride flush  3 mL Intravenous Q12H   Continuous Infusions:  sodium chloride     PRN Meds: sodium chloride, acetaminophen, sodium chloride flush   Vital Signs    Vitals:   01/21/23 0807 01/21/23 1653 01/21/23 2048 01/22/23 0511  BP: (!) 118/94 112/76 127/84 113/67  Pulse:  61 60 (!) 58  Resp:  '16 16 16  '$ Temp:  (!) 96.7 F (35.9 C) 97.7 F (36.5 C) 97.7 F (36.5 C)  TempSrc:  Axillary Oral Oral  SpO2:  100% 96% 97%  Weight:      Height:        Intake/Output Summary (Last 24 hours) at 01/22/2023 0958 Last data filed at 01/22/2023 0847 Gross per 24 hour  Intake 180 ml  Output --  Net 180 ml      01/20/2023    2:52 PM 01/20/2023   11:05 AM 01/09/2023    7:49 AM  Last 3 Weights  Weight (lbs) 153 lb 156 lb 150 lb 14.4 oz  Weight (kg) 69.4 kg 70.761 kg 68.448 kg      Telemetry    SB/SR 60's-60s, 1st degree AVblock - Personally Reviewed  ECG    SR 60bpm, 1st degree AVblock 253m, QTc 4653m Personally Reviewed with Dr. LaQuentin OrePhysical Exam   GEN: No acute distress.   Neck: No JVD Cardiac: RRR, no murmurs, rubs, or gallops.  Respiratory: CTA b/l. GI: Soft, nontender, non-distended  MS: No edema; No deformity. Neuro:  Nonfocal  Psych: Normal affect   Labs    High Sensitivity Troponin:  No results for input(s): "TROPONINIHS" in the last 720 hours.   Chemistry Recent Labs  Lab 01/20/23 1104 01/21/23 0247 01/22/23 0143  NA 139 139 136  K 3.8 4.0 4.0  CL 105 108 102  CO2 '25 24 24  '$ GLUCOSE 89 99 100*  BUN '13 14 19  '$ CREATININE 0.82 0.73 0.75  CALCIUM 9.2 8.8* 8.9  MG 2.2 2.3 2.4  GFRNONAA >60 >60  >60  ANIONGAP '9 7 10    '$ Lipids No results for input(s): "CHOL", "TRIG", "HDL", "LABVLDL", "LDLCALC", "CHOLHDL" in the last 168 hours.  HematologyNo results for input(s): "WBC", "RBC", "HGB", "HCT", "MCV", "MCH", "MCHC", "RDW", "PLT" in the last 168 hours. Thyroid No results for input(s): "TSH", "FREET4" in the last 168 hours.  BNPNo results for input(s): "BNP", "PROBNP" in the last 168 hours.  DDimer No results for input(s): "DDIMER" in the last 168 hours.   Radiology    No results found.  Cardiac Studies   Echo 10/20/22    1. Left ventricular ejection fraction, by estimation, is 40 to 45%. (50% per Dr AgGaren [place]) Left ventricular ejection fraction by 2D MOD biplane is 41.2 %. The left ventricle has mildly decreased function. The left ventricle demonstrates global hypokinesis. Left ventricular diastolic function could not be evaluated.   2. Right ventricular systolic function is normal. The right ventricular  size is normal. There is normal pulmonary artery systolic pressure. The  estimated right ventricular systolic pressure is 2948.1mHg.   3. Left atrial size  was massively dilated.   4. Right atrial size was massively dilated.   5. The mitral valve is abnormal. Mild mitral valve regurgitation.   6. The aortic valve is tricuspid. Aortic valve regurgitation is not  visualized.   7. The inferior vena cava is normal in size with greater than 50%  respiratory variability, suggesting right atrial pressure of 3 mmHg.   Patient Profile     72 y.o. female w/PMHx of AFib admitted for [place]    Persistent AFib CHA2DS2Vasc is 2, on Xareto Tikosyn load is in progress K+ 4.0 Mag 2.4 Creat 0.75 (stable)  Converted with drug QTc is stable  Anticipate discharge tomorrow    For questions or updates, please contact [place] Please consult [email or web address] for contact info under        Signed, Baldwin [place], PA-C  01/22/2023, 9:58 AM

## 2023-01-22 NOTE — Care Management (Signed)
01-22-23 1153 Case Manager spoke with the patient regarding co pay cost. Patient is agreeable to cost and would like to have the initial Rx filled via [facility] and the Rx refills 90 day supply escribed to [facility] [place]. No further needs identified at this time.

## 2023-01-23 ENCOUNTER — Other Ambulatory Visit (HOSPITAL_COMMUNITY): Payer: Self-pay

## 2023-01-23 LAB — BASIC METABOLIC PANEL
Anion gap: 8 (ref 5–15)
BUN: 15 mg/dL (ref 8–23)
CO2: 24 mmol/L (ref 22–32)
Calcium: 8.6 mg/dL — ABNORMAL LOW (ref 8.9–10.3)
Chloride: 105 mmol/L (ref 98–111)
Creatinine, Ser: 0.68 mg/dL (ref 0.44–1.00)
GFR, Estimated: >60 mL/min (ref >60)
Glucose, Bld: 96 mg/dL (ref 70–99)
Potassium: 3.8 mmol/L (ref 3.5–5.1)
Sodium: 137 mmol/L (ref 135–145)

## 2023-01-23 LAB — MAGNESIUM: Magnesium: 2.1 mg/dL (ref 1.7–2.4)

## 2023-01-23 MED ORDER — DOFETILIDE 500 MCG PO CAPS
500.0000 ug | ORAL_CAPSULE | Freq: Two times a day (BID) | ORAL | 5 refills | Status: DC
  Filled 2023-01-23: qty 60, 30d supply, fill #0

## 2023-01-23 MED ORDER — POTASSIUM CHLORIDE CRYS ER 20 MEQ PO TBCR
20.0000 meq | EXTENDED_RELEASE_TABLET | Freq: Every day | ORAL | 5 refills | Status: DC
  Filled 2023-01-23: qty 30, 30d supply, fill #0

## 2023-01-23 MED ORDER — POTASSIUM CHLORIDE CRYS ER 20 MEQ PO TBCR
20.0000 meq | EXTENDED_RELEASE_TABLET | Freq: Two times a day (BID) | ORAL | 5 refills | Status: DC
  Filled 2023-01-23: qty 30, 15d supply, fill #0

## 2023-01-23 MED ORDER — POTASSIUM CHLORIDE CRYS ER 20 MEQ PO TBCR
40.0000 meq | EXTENDED_RELEASE_TABLET | Freq: Once | ORAL | Status: AC
  Administered 2023-01-23: 40 meq via ORAL
  Filled 2023-01-23: qty 2

## 2023-01-23 NOTE — Discharge Summary (Addendum)
ELECTROPHYSIOLOGY PROCEDURE DISCHARGE SUMMARY    Patient ID: Kathy Holt,  MRN: [id number], DOB/AGE: August 03, 1951 72 y.o.  Admit date: 01/20/2023 Discharge date: 01/23/2023  Primary Care Physician: Bo Merino, FNP Primary Cardiologist: Dr. Garen Lah Electrophysiologist: Michel Bickers. Quentin Ore  Primary Discharge Diagnosis:  1.  persistent atrial fibrillation status post Tikosyn loading this admission      CHA2DS2Vasc is 2, on Xarelto  Secondary Discharge Diagnosis:  none  Allergies  Allergen Reactions   Latex Rash    Contact dermatitis     Procedures This Admission:  1.  Tikosyn loading   Brief HPI: Kathy Holt is a 72 y.o. female with a past medical history as noted above.  They were referred to EP in the outpatient setting for treatment options of atrial fibrillation.  Risks, benefits, and alternatives to Tikosyn were reviewed with the patient who wished to proceed.    Hospital Course:  The patient was admitted and Tikosyn was initiated.  Renal function and electrolytes were followed during the hospitalization.   The patient's QTc remained stable.  She converted with drug and did not require DCCV.  She was monitored until discharge on telemetry which demonstrated SB/SR.  On the day of discharge, she feels well, was examined by Dr Quentin Ore who considered the patient stable for discharge to home.  Follow-up has been arranged with the AFib clinic in 1 week and with EP team in 4 weeks.    Tikosyn teaching was completed electrolyte replacement for home [place] 25mq daily  Physical Exam: Vitals:   01/22/23 0511 01/22/23 1406 01/22/23 1959 01/23/23 0539  BP: 113/67 120/80 123/76 127/75  Pulse: (!) 58 (!) 51 60 90  Resp: 16 16 18 16  $ Temp: 97.7 F (36.5 C) 97.9 F (36.6 C) 97.9 F (36.6 C) 97.6 F (36.4 C)  TempSrc: Oral Oral Oral Oral  SpO2: 97% 98% 98% 98%  Weight:      Height:         GEN- The patient is well appearing, alert and oriented x 3 today.    HEENT: normocephalic, atraumatic; sclera clear, conjunctiva pink; hearing intact; oropharynx clear; neck supple, no JVP Lymph- no cervical lymphadenopathy Lungs-  CTA b/l, normal work of breathing.  No wheezes, rales, rhonchi Heart- RRR, no murmurs, rubs or gallops, PMI not laterally displaced GI- soft, non-tender, non-distended Extremities- no clubbing, cyanosis, or edema MS- no significant deformity or atrophy Skin- warm and dry, no rash or lesion Psych- euthymic mood, full affect Neuro- strength and sensation are intact   Labs:   Lab Results  Component Value Date   WBC 7.5 10/28/2022   HGB 15.3 (H) 10/28/2022   HCT 45.0 10/28/2022   MCV 91.5 10/28/2022   PLT 217 10/28/2022    Recent Labs  Lab 01/23/23 0222  NA 137  K 3.8  CL 105  CO2 24  BUN 15  CREATININE 0.68  CALCIUM 8.6*  GLUCOSE 96     Discharge Medications:  Allergies as of 01/23/2023       Reactions   Latex Rash   Contact dermatitis        Medication List     TAKE these medications    acetaminophen 500 MG tablet Commonly known as: TYLENOL Take 500 mg by mouth as needed for moderate pain.   bisoprolol 5 MG tablet Commonly known as: ZEBETA Take 0.5 tablets (2.5 mg total) by mouth daily.   dofetilide 500 MCG capsule Commonly known as: TIKOSYN Take 1  capsule (500 mcg total) by mouth 2 (two) times daily.   MAGNESIUM GLYCINATE PO Take 400 mg by mouth at bedtime.   OVER THE COUNTER MEDICATION Take 1 tablet by mouth daily. Calcium Algae   potassium chloride SA 20 MEQ tablet Commonly known as: KLOR-CON M Take 1 tablet (20 mEq total) by mouth daily.   rivaroxaban 20 MG Tabs tablet Commonly known as: XARELTO Take 20 mg by mouth daily with supper.        Disposition: Home Discharge Instructions     Diet - low sodium heart healthy   Complete by: As directed    Increase activity slowly   Complete by: As directed         Duration of Discharge Encounter: Greater than 30 minutes  including physician time.  Venetia Night, PA-C 01/23/2023 1:57 PM

## 2023-01-23 NOTE — Progress Notes (Addendum)
Pharmacy: Dofetilide (Tikosyn) - Follow Up Assessment and Electrolyte Replacement  Pharmacy consulted to assist in monitoring and replacing electrolytes in this 72 y.o. female admitted on 01/20/2023 undergoing dofetilide initiation. First dofetilide dose: 500 mcg @ 2/6 8 pm  Labs:    Component Value Date/Time   K 3.8 01/23/2023 0222   MG 2.1 01/23/2023 0222     Plan: Potassium: K 3.8-3.9:  Give KCl 40 mEq po x1   Magnesium: Mg > 2: No additional supplementation needed  As patient has required on average 20 mEq of potassium replacement every day, recommend discharging patient with prescription for:  Potassium chloride 20 mEq  daily  Thank you for allowing pharmacy to participate in this patient's care   Antonietta Jewel, PharmD, [place] Pharmacist  Phone: [phone] 01/23/2023 7:24 AM  Please check AMION for all [place] phone numbers After 10:00 PM, call [place] [phone]

## 2023-01-26 ENCOUNTER — Telehealth: Payer: Self-pay

## 2023-01-26 NOTE — Transitions of Care (Post Inpatient/ED Visit) (Signed)
   01/26/2023  Name: Kathy Holt MRN: [id number] DOB: [birth year]  Today's TOC FU Call Status: Today's TOC FU Call Status:: Successful TOC FU Call Competed TOC FU Call Complete Date: 01/26/23  Transition Care Management Follow-up Telephone Call    Items Reviewed: Did you receive and understand the discharge instructions provided?: Yes Medications obtained and verified?: Yes (Medications Reviewed) Any new allergies since your discharge?: No Dietary orders reviewed?: Yes  Home Care and Equipment/Supplies: [place] Ordered?: NA Any new equipment or medical supplies ordered?: NA  Functional Questionnaire: Do you need assistance with bathing/showering or dressing?: No Do you need assistance with meal preparation?: No Do you need assistance with eating?: No Do you have difficulty maintaining continence: No Do you need assistance with getting out of bed/getting out of a chair/moving?: No Do you have difficulty managing or taking your medications?: No  Folllow up appointments reviewed: PCP Follow-up appointment confirmed?: NA Specialist Hospital Follow-up appointment confirmed?: Yes Date of Specialist follow-up appointment?: 01/29/23 Follow-Up Specialty Provider:: [place] Do you need transportation to your follow-up appointment?: No Do you understand care options if your condition(s) worsen?: Yes-patient verbalized understanding    SIGNATURE Juanda Crumble, [place] Nurse Health Advisor Direct Dial [phone]

## 2023-01-29 ENCOUNTER — Ambulatory Visit (HOSPITAL_COMMUNITY)
Admit: 2023-01-29 | Discharge: 2023-01-29 | Disposition: A | Discharge status: Home / Self Care | Payer: BC Managed Care – PPO | Source: Ambulatory Visit | Attending: Physician Assistant | Admitting: Physician Assistant

## 2023-01-29 ENCOUNTER — Encounter (HOSPITAL_COMMUNITY): Payer: Self-pay | Admitting: Physician Assistant

## 2023-01-29 VITALS — BP 136/90 | HR 54 | Ht 63.0 in | Wt 159.6 lb

## 2023-01-29 DIAGNOSIS — I4819 Other persistent atrial fibrillation: Secondary | ICD-10-CM | POA: Diagnosis not present

## 2023-01-29 DIAGNOSIS — Z79899 Other long term (current) drug therapy: Secondary | ICD-10-CM | POA: Diagnosis not present

## 2023-01-29 DIAGNOSIS — Z7901 Long term (current) use of anticoagulants: Secondary | ICD-10-CM | POA: Diagnosis not present

## 2023-01-29 LAB — BASIC METABOLIC PANEL
Anion gap: 6 (ref 5–15)
BUN: 11 mg/dL (ref 8–23)
CO2: 25 mmol/L (ref 22–32)
Calcium: 9.1 mg/dL (ref 8.9–10.3)
Chloride: 108 mmol/L (ref 98–111)
Creatinine, Ser: 0.7 mg/dL (ref 0.44–1.00)
GFR, Estimated: >60 mL/min (ref >60)
Glucose, Bld: 81 mg/dL (ref 70–99)
Potassium: 4 mmol/L (ref 3.5–5.1)
Sodium: 139 mmol/L (ref 135–145)

## 2023-01-29 LAB — MAGNESIUM: Magnesium: 2.3 mg/dL (ref 1.7–2.4)

## 2023-01-29 MED ORDER — DOFETILIDE 500 MCG PO CAPS: 500.0000 ug | ORAL_CAPSULE | Freq: Two times a day (BID) | ORAL | 5 refills | Status: DC

## 2023-01-29 NOTE — Progress Notes (Signed)
Primary Care Physician: Bo Merino, FNP Primary Cardiologist: Dr Garen Lah Primary Electrophysiologist: Dr Quentin Ore Referring Physician: Dr Areatha Keas is a 72 y.o. female with a history of atrial fibrillation who presents for follow up in the [facility].  The patient was initially diagnosed with atrial fibrillation in 2019 after presenting for a colonoscopy. Patient is on Xarelto for a CHADS2VASC score of 2. She has continued to have paroxysms of afib since. She underwent DCCV on 10/30/22 but had quick return of afib. She was seen by Dr Quentin Ore who recommended dofetilide as a bridge to ablation.   On follow up today, patient is s/p dofetilide loading 2/6-01/23/23. She converted with the medication and did not require DCCV. Patient reports she did have another episode of afib after leaving the hospital but she spontaneously converted and has not had any since. She does feel that she has more energy.   Today, she denies symptoms of palpitations, chest pain, shortness of breath, orthopnea, PND, lower extremity edema, dizziness, presyncope, syncope, snoring, daytime somnolence, bleeding, or neurologic sequela. The patient is tolerating medications without difficulties and is otherwise without complaint today.    Atrial Fibrillation Risk Factors:  she does not have symptoms or diagnosis of sleep apnea. she does not have a history of rheumatic fever.   she has a BMI of Body mass index is 28.27 kg/m.Marland Kitchen Filed Weights   01/29/23 0854  Weight: 72.4 kg    Family History  Problem Relation Age of Onset   Cancer - Lung Mother    Pancreatic cancer Father    Alcoholism Brother      Atrial Fibrillation Management history:  Previous antiarrhythmic drugs: dofetilide  Previous cardioversions: 10/30/22 Previous ablations: none CHADS2VASC score: 2 Anticoagulation history: Xarelto   Past Medical History:  Diagnosis Date   Arthritis     lower back   DDD (degenerative disc disease), cervical    Dysrhythmia    Afib   Past Surgical History:  Procedure Laterality Date   ABDOMINAL HYSTERECTOMY     partial   BLADDER SUSPENSION  1998   CARDIOVERSION N/A 10/30/2022   Procedure: CARDIOVERSION;  Surgeon: Minna Merritts, MD;  Location: [facility] ORS;  Service: Cardiovascular;  Laterality: N/A;   COLONOSCOPY     KNEE ARTHROSCOPY WITH MEDIAL MENISECTOMY Left 01/30/2021   Procedure: KNEE ARTHROSCOPY WITH PARTIAL MEDIAL/LATERAL MENISECTOMY, PARTIAL SYNOVECTOMY;  Surgeon: Lovell Sheehan, MD;  Location: [facility] ORS;  Service: Orthopedics;  Laterality: Left;    Current Outpatient Medications  Medication Sig Dispense Refill   acetaminophen (TYLENOL) 500 MG tablet Take 500 mg by mouth as needed for moderate pain.     bisoprolol (ZEBETA) 5 MG tablet Take 0.5 tablets (2.5 mg total) by mouth daily.     dofetilide (TIKOSYN) 500 MCG capsule Take 1 capsule (500 mcg total) by mouth 2 (two) times daily. 60 capsule 5   MAGNESIUM GLYCINATE PO Take 400 mg by mouth at bedtime.     OVER THE COUNTER MEDICATION Take 1 tablet by mouth daily. Calcium Algae     potassium chloride SA (KLOR-CON M) 20 MEQ tablet Take 1 tablet (20 mEq total) by mouth daily. 30 tablet 5   rivaroxaban (XARELTO) 20 MG TABS tablet Take 20 mg by mouth daily with supper.     No current facility-administered medications for this encounter.    Allergies  Allergen Reactions   Latex Rash    Contact dermatitis    Social History  Socioeconomic History   Marital status: Married    Spouse name: andrew   Number of children: Not on file   Years of education: Not on file   Highest education level: Not on file  Occupational History   Occupation: run Technical sales engineer. at [employer]    Comment: full time  Tobacco Use   Smoking status: Never   Smokeless tobacco: Never   Tobacco comments:    Never smoke 01/20/23  Vaping Use   Vaping Use: Never used  Substance and Sexual  Activity   Alcohol use: Never    Comment: not in 20 years   Drug use: Never   Sexual activity: Not Currently  Other Topics Concern   Not on file  Social History Narrative   Patient lives with husband and feels safe in her home.  She still works full time.   Social Determinants of Health   Financial Resource Strain: Low Risk  (10/06/2022)   Overall Financial Resource Strain (CARDIA)    Difficulty of Paying Living Expenses: Not hard at all  Food Insecurity: No Food Insecurity (01/20/2023)   Hunger Vital Sign    Worried About Running Out of Food in the Last Year: Never true    Ran Out of Food in the Last Year: Never true  Transportation Needs: No Transportation Needs (01/20/2023)   PRAPARE - Hydrologist (Medical): No    Lack of Transportation (Non-Medical): No  Physical Activity: Sufficiently Active (10/06/2022)   Exercise Vital Sign    Days of Exercise per Week: 5 days    Minutes of Exercise per Session: 30 min  Stress: Stress Concern Present (10/06/2022)   [place]    Feeling of Stress : Very much  Social Connections: Moderately Isolated (10/06/2022)   Social Connection and Isolation Panel [NHANES]    Frequency of Communication with Friends and Family: Three times a week    Frequency of Social Gatherings with Friends and Family: Three times a week    Attends Religious Services: Never    Active Member of Clubs or Organizations: No    Attends Archivist Meetings: Never    Marital Status: Married  Human resources officer Violence: Not At Risk (01/20/2023)   Humiliation, Afraid, Rape, and Kick questionnaire    Fear of Current or Ex-Partner: No    Emotionally Abused: No    Physically Abused: No    Sexually Abused: No     ROS- All systems are reviewed and negative except as per the HPI above.  Physical Exam: Vitals:   01/29/23 0854  BP: (!) 136/90  Pulse: (!) 54  Weight: 72.4  kg  Height: 5' 3"$  (1.6 m)     GEN- The patient is a well appearing female, alert and oriented x 3 today.   HEENT-head normocephalic, atraumatic, sclera clear, conjunctiva pink, hearing intact, trachea midline. Lungs- Clear to ausculation bilaterally, normal work of breathing Heart- Regular rate and rhythm, no murmurs, rubs or gallops  GI- soft, NT, ND, + BS Extremities- no clubbing, cyanosis, or edema MS- no significant deformity or atrophy Skin- no rash or lesion Psych- euthymic mood, full affect Neuro- strength and sensation are intact   Wt Readings from Last 3 Encounters:  01/29/23 72.4 kg  01/20/23 69.4 kg  01/20/23 70.8 kg    EKG today demonstrates  SB, 1st degree AV block Vent. rate 54 BPM PR interval 244 ms QRS duration 88 ms QT/QTcB 472/447  ms  Echo 10/20/22 demonstrated   1. Left ventricular ejection fraction, by estimation, is 40 to 45%. (50% per Dr Garen Lah review) Left ventricular ejection fraction by 2D MOD biplane is 41.2 %. The left ventricle has mildly decreased function. The left ventricle demonstrates global hypokinesis. Left ventricular diastolic function could not be evaluated.   2. Right ventricular systolic function is normal. The right ventricular  size is normal. There is normal pulmonary artery systolic pressure. The  estimated right ventricular systolic pressure is [id number] mmHg.   3. Left atrial size was massively dilated.   4. Right atrial size was massively dilated.   5. The mitral valve is abnormal. Mild mitral valve regurgitation.   6. The aortic valve is tricuspid. Aortic valve regurgitation is not  visualized.   7. The inferior vena cava is normal in size with greater than 50%  respiratory variability, suggesting right atrial pressure of 3 mmHg.   Comparison(s): No prior Echocardiogram.   Epic records are reviewed at length today  CHA2DS2-VASc Score = 2  The patient's score is based upon: CHF History: 0 HTN History: 0 Diabetes History:  0 Stroke History: 0 Vascular Disease History: 0 Age Score: 1 Gender Score: 1       ASSESSMENT AND PLAN: 1. Persistent Atrial Fibrillation (ICD10:  I48.19) The patient's CHA2DS2-VASc score is 2, indicating a 2.2% annual risk of stroke.   S/p dofetilide admission 2/6-01/23/23 Patient in [place] today.  Continue dofetilide 500 mcg BID. QT stable. Check bmet/mag today. Continue Xarelto 20 mg daily Continue bisoprolol 2.5 mg daily Patient is scheduled for afib ablation 03/02/23 Smart watch for home monitoring.    Follow up with Dr Quentin Ore as scheduled.    [facility] [address] [place], [place] [address] [phone] 01/29/2023 9:05 AM

## 2023-02-05 ENCOUNTER — Encounter: Payer: Self-pay | Admitting: Nurse Practitioner

## 2023-02-05 ENCOUNTER — Other Ambulatory Visit: Payer: Self-pay | Admitting: Nurse Practitioner

## 2023-02-05 DIAGNOSIS — I4891 Unspecified atrial fibrillation: Secondary | ICD-10-CM

## 2023-02-05 MED ORDER — RIVAROXABAN 20 MG PO TABS: 20.0000 mg | ORAL_TABLET | Freq: Every day | ORAL | 1 refills | Status: DC

## 2023-02-12 ENCOUNTER — Ambulatory Visit
Admission: RE | Admit: 2023-02-12 | Discharge: 2023-02-12 | Disposition: A | Discharge status: Home / Self Care | Payer: BC Managed Care – PPO | Source: Ambulatory Visit | Attending: Nurse Practitioner | Admitting: Nurse Practitioner

## 2023-02-12 DIAGNOSIS — Z1231 Encounter for screening mammogram for malignant neoplasm of breast: Secondary | ICD-10-CM | POA: Insufficient documentation

## 2023-02-12 DIAGNOSIS — M810 Age-related osteoporosis without current pathological fracture: Secondary | ICD-10-CM

## 2023-02-13 ENCOUNTER — Encounter: Payer: Self-pay | Admitting: Cardiology

## 2023-02-16 MED ORDER — POTASSIUM CHLORIDE CRYS ER 20 MEQ PO TBCR: 20.0000 meq | EXTENDED_RELEASE_TABLET | Freq: Every day | ORAL | 5 refills | Status: DC

## 2023-02-24 ENCOUNTER — Other Ambulatory Visit
Admission: RE | Admit: 2023-02-24 | Discharge: 2023-02-24 | Disposition: A | Discharge status: Home / Self Care | Payer: BC Managed Care – PPO | Attending: Cardiology | Admitting: Cardiology

## 2023-02-24 DIAGNOSIS — Z01812 Encounter for preprocedural laboratory examination: Secondary | ICD-10-CM | POA: Insufficient documentation

## 2023-02-24 DIAGNOSIS — I4819 Other persistent atrial fibrillation: Secondary | ICD-10-CM

## 2023-02-24 LAB — CBC
HCT: 41.9 % (ref 36.0–46.0)
Hemoglobin: 14.2 g/dL (ref 12.0–15.0)
MCH: 31.6 pg (ref 26.0–34.0)
MCHC: 33.9 g/dL (ref 30.0–36.0)
MCV: 93.3 fL (ref 80.0–100.0)
Platelets: 186 10*3/uL (ref 150–400)
RBC: 4.49 MIL/uL (ref 3.87–5.11)
RDW: 11.9 % (ref 11.5–15.5)
WBC: 6 10*3/uL (ref 4.0–10.5)
nRBC: 0 % (ref 0.0–0.2)

## 2023-02-24 LAB — BASIC METABOLIC PANEL
Anion gap: 3 — ABNORMAL LOW (ref 5–15)
BUN: 13 mg/dL (ref 8–23)
CO2: 26 mmol/L (ref 22–32)
Calcium: 9 mg/dL (ref 8.9–10.3)
Chloride: 109 mmol/L (ref 98–111)
Creatinine, Ser: 0.59 mg/dL (ref 0.44–1.00)
GFR, Estimated: >60 mL/min (ref >60)
Glucose, Bld: 103 mg/dL — ABNORMAL HIGH (ref 70–99)
Potassium: 3.8 mmol/L (ref 3.5–5.1)
Sodium: 138 mmol/L (ref 135–145)

## 2023-02-24 NOTE — Progress Notes (Signed)
Electrophysiology Office Follow up Visit Note:    Date:  02/25/2023   ID:  Kathy Holt, DOB 1951/04/30, MRN [id number]  PCP:  Bo Merino, FNP  [facility] Cardiologist:  None  [facility] Electrophysiologist:  Vickie Epley, MD    Interval History:    Kathy Holt is a 72 y.o. female who presents for a follow up visit.   She has persistent atrial fibrillation.  She was seen by Audry Pili on January 29, 2023 after her hospitalization earlier in the month for Tikosyn loading.  She is scheduled for catheter ablation March 02, 2023.  She is on Xarelto for stroke prophylaxis.  She reports 3 episodes of atrial fibrillation since starting the Tikosyn.  Some of the episodes have lasted overnight.  She has not missed any doses.  She continues to take Xarelto without bleeding issues.  She tells me that she feels much better now that she is maintaining normal rhythm.  She said she did not understand how bad she felt prior to having her normal rhythm restored.    Past medical, surgical, social and family history were reviewed.  ROS:   Please see the history of present illness.    All other systems reviewed and are negative.  EKGs/Labs/Other Studies Reviewed:    The following studies were reviewed today:  EKG:  The ekg ordered today demonstrates sinus bradycardia with first-degree AV delay.  QTc is 464 ms.   Physical Exam:    VS:  BP 116/66   Pulse (!) 51   Ht '5\' 3"'$  (1.6 m)   Wt 155 lb 9.6 oz (70.6 kg)   LMP  (LMP Unknown)   SpO2 97%   BMI 27.56 kg/m     Wt Readings from Last 3 Encounters:  02/25/23 155 lb 9.6 oz (70.6 kg)  01/29/23 159 lb 9.6 oz (72.4 kg)  01/20/23 153 lb (69.4 kg)     GEN:  Well nourished, well developed in no acute distress CARDIAC: RRR, no murmurs, rubs, gallops RESPIRATORY:  Clear to auscultation without rales, wheezing or rhonchi       ASSESSMENT:    1. Persistent atrial fibrillation ([place])   2. Encounter for long-term  (current) use of high-risk medication   3. Primary hypertension    PLAN:    In order of problems listed above:  #Persistent atrial fibrillation #High risk med monitoring-[place] Planning for catheter ablation later this month.  The patient has reported A-fib after starting Tikosyn.  Currently on Tikosyn 500 mcg by mouth twice daily.  Also takes Xarelto for stroke prophylaxis.  Risk, benefits, and alternatives to EP study and radiofrequency ablation for afib were also discussed in detail today. These risks include but are not limited to stroke, bleeding, vascular damage, tamponade, perforation, damage to the esophagus, lungs, and other structures, pulmonary vein stenosis, worsening renal function, and death. The patient understands these risk and wishes to proceed.  We will therefore proceed with catheter ablation at the next available time.  Carto, ICE, anesthesia are requested for the procedure.  Will also obtain CT PV protocol prior to the procedure to exclude LAA thrombus and further evaluate atrial anatomy.  Today's EKG shows a stable QTc for ongoing Tikosyn use.  Blood work yesterday with normal kidney function.  #Hypertension At goal today.  Recommend checking blood pressures 1-2 times per week at home and recording the values.  Recommend bringing these recordings to the primary care physician.      Signed, Lars Mage,  MD, [facility], [facility] 02/25/2023 11:51 AM    Electrophysiology [facility]

## 2023-02-24 NOTE — H&P (View-Only) (Signed)
Electrophysiology Office Follow up Visit Note:    Date:  02/25/2023   ID:  Kathy Holt, DOB 08/26/1951, MRN [id number], MRN [id number]  PCP:  Pender, Julie F, FNP  [facility] Cardiologist:  None  [facility] Electrophysiologist:  Nishan Ovens T Rocket Gunderson, MD    Interval History:    Kathy Holt is a 72 y.o. female who presents for a follow up visit.   She has persistent atrial fibrillation.  She was seen by Kathy Holt on January 29, 2023 after her hospitalization earlier in the month for Tikosyn loading.  She is scheduled for catheter ablation March 02, 2023.  She is on Xarelto for stroke prophylaxis.  She reports 3 episodes of atrial fibrillation since starting the Tikosyn.  Some of the episodes have lasted overnight.  She has not missed any doses.  She continues to take Xarelto without bleeding issues.  She tells me that she feels much better now that she is maintaining normal rhythm.  She said she did not understand how bad she felt prior to having her normal rhythm restored.    Past medical, surgical, social and family history were reviewed.  ROS:   Please see the history of present illness.    All other systems reviewed and are negative.  EKGs/Labs/Other Studies Reviewed:    The following studies were reviewed today:  EKG:  The ekg ordered today demonstrates sinus bradycardia with first-degree AV delay.  QTc is 464 ms.   Physical Exam:    VS:  BP 116/66   Pulse (!) 51   Ht 5' 3" (1.6 m)   Wt 155 lb 9.6 oz (70.6 kg)   LMP  (LMP Unknown)   SpO2 97%   BMI 27.56 kg/m     Wt Readings from Last 3 Encounters:  02/25/23 155 lb 9.6 oz (70.6 kg)  01/29/23 159 lb 9.6 oz (72.4 kg)  01/20/23 153 lb (69.4 kg)     GEN:  Well nourished, well developed in no acute distress CARDIAC: RRR, no murmurs, rubs, gallops RESPIRATORY:  Clear to auscultation without rales, wheezing or rhonchi       ASSESSMENT:    1. Persistent atrial fibrillation (HCC)   2. Encounter for long-term  (current) use of high-risk medication   3. Primary hypertension    PLAN:    In order of problems listed above:  #Persistent atrial fibrillation #High risk med monitoring-[place] Planning for catheter ablation later this month.  The patient has reported A-fib after starting Tikosyn.  Currently on Tikosyn 500 mcg by mouth twice daily.  Also takes Xarelto for stroke prophylaxis.  Risk, benefits, and alternatives to EP study and radiofrequency ablation for afib were also discussed in detail today. These risks include but are not limited to stroke, bleeding, vascular damage, tamponade, perforation, damage to the esophagus, lungs, and other structures, pulmonary vein stenosis, worsening renal function, and death. The patient understands these risk and wishes to proceed.  We will therefore proceed with catheter ablation at the next available time.  Carto, ICE, anesthesia are requested for the procedure.  Will also obtain CT PV protocol prior to the procedure to exclude LAA thrombus and further evaluate atrial anatomy.  Today's EKG shows a stable QTc for ongoing Tikosyn use.  Blood work yesterday with normal kidney function.  #Hypertension At goal today.  Recommend checking blood pressures 1-2 times per week at home and recording the values.  Recommend bringing these recordings to the primary care physician.      Signed, Shuna Tabor,   MD, FACC, FHRS 02/25/2023 11:51 AM    Electrophysiology [facility] 

## 2023-02-25 ENCOUNTER — Encounter: Payer: Self-pay | Admitting: Cardiology

## 2023-02-25 ENCOUNTER — Telehealth (HOSPITAL_COMMUNITY): Payer: Self-pay | Admitting: Emergency Medicine

## 2023-02-25 ENCOUNTER — Ambulatory Visit: Payer: BC Managed Care – PPO | Attending: Cardiology | Admitting: Cardiology

## 2023-02-25 VITALS — BP 116/66 | HR 51 | Ht 63.0 in | Wt 155.6 lb

## 2023-02-25 DIAGNOSIS — I4819 Other persistent atrial fibrillation: Secondary | ICD-10-CM

## 2023-02-25 DIAGNOSIS — Z79899 Other long term (current) drug therapy: Secondary | ICD-10-CM | POA: Diagnosis not present

## 2023-02-25 DIAGNOSIS — I10 Essential (primary) hypertension: Secondary | ICD-10-CM | POA: Diagnosis not present

## 2023-02-25 NOTE — Patient Instructions (Signed)
Medication Instructions:  Your physician recommends that you continue on your current medications as directed. Please refer to the Current Medication list given to you today.  *If you need a refill on your cardiac medications before your next appointment, please call your pharmacy   Follow-Up: At [facility], you and your health needs are our priority.  As part of our continuing mission to provide you with exceptional heart care, we have created designated Provider Care Teams.  These Care Teams include your primary Cardiologist (physician) and Advanced Practice Providers (APPs -  Physician Assistants and Nurse Practitioners) who all work together to provide you with the care you need, when you need it.  Your next appointment:   As scheduled  

## 2023-02-25 NOTE — Telephone Encounter (Signed)
Attempted to call patient regarding upcoming cardiac CT appointment. °Left message on voicemail with name and callback number °Wilma Michaelson RN Navigator Cardiac Imaging °[facility] °[phone] Office °[phone] Cell ° °

## 2023-02-27 ENCOUNTER — Ambulatory Visit
Admission: RE | Admit: 2023-02-27 | Discharge: 2023-02-27 | Disposition: A | Discharge status: Home / Self Care | Payer: BC Managed Care – PPO | Source: Ambulatory Visit | Attending: Cardiology | Admitting: Cardiology

## 2023-02-27 DIAGNOSIS — I4819 Other persistent atrial fibrillation: Secondary | ICD-10-CM | POA: Insufficient documentation

## 2023-02-27 MED ORDER — IOHEXOL 350 MG/ML SOLN
75.0000 mL | Freq: Once | INTRAVENOUS | Status: AC | PRN
  Administered 2023-02-27: 75 mL via INTRAVENOUS

## 2023-02-27 NOTE — Pre-Procedure Instructions (Signed)
Instructed patient on the following items: Arrival time 0515 Nothing to eat or drink after midnight No meds AM of procedure Responsible person to drive you home and stay with you for 24 hrs  Have you missed any doses of anti-coagulant Xarelto- hasn't missed any doses   

## 2023-03-02 ENCOUNTER — Other Ambulatory Visit: Payer: Self-pay

## 2023-03-02 ENCOUNTER — Other Ambulatory Visit (HOSPITAL_COMMUNITY): Payer: Self-pay

## 2023-03-02 ENCOUNTER — Ambulatory Visit (HOSPITAL_COMMUNITY): Payer: BC Managed Care – PPO | Admitting: Anesthesiology

## 2023-03-02 ENCOUNTER — Encounter (HOSPITAL_COMMUNITY): Payer: Self-pay | Admitting: Cardiology

## 2023-03-02 ENCOUNTER — Encounter (HOSPITAL_COMMUNITY)
Admission: RE | Disposition: A | Discharge status: Home / Self Care | Payer: BC Managed Care – PPO | Source: Home / Self Care | Attending: Cardiology

## 2023-03-02 ENCOUNTER — Ambulatory Visit (HOSPITAL_COMMUNITY)
Admission: RE | Admit: 2023-03-02 | Discharge: 2023-03-02 | Disposition: A | Discharge status: Home / Self Care | Payer: BC Managed Care – PPO | Attending: Cardiology | Admitting: Cardiology

## 2023-03-02 DIAGNOSIS — I10 Essential (primary) hypertension: Secondary | ICD-10-CM | POA: Diagnosis not present

## 2023-03-02 DIAGNOSIS — Z79899 Other long term (current) drug therapy: Secondary | ICD-10-CM | POA: Insufficient documentation

## 2023-03-02 DIAGNOSIS — Z7901 Long term (current) use of anticoagulants: Secondary | ICD-10-CM | POA: Diagnosis not present

## 2023-03-02 DIAGNOSIS — I4819 Other persistent atrial fibrillation: Secondary | ICD-10-CM | POA: Insufficient documentation

## 2023-03-02 HISTORY — PX: ATRIAL FIBRILLATION ABLATION: EP1191

## 2023-03-02 LAB — POCT ACTIVATED CLOTTING TIME
Activated Clotting Time: 282 seconds
Activated Clotting Time: 303 seconds

## 2023-03-02 SURGERY — ATRIAL FIBRILLATION ABLATION
Anesthesia: General

## 2023-03-02 MED ORDER — SODIUM CHLORIDE 0.9% FLUSH: 3.0000 mL | INTRAVENOUS | Status: DC | PRN

## 2023-03-02 MED ORDER — PANTOPRAZOLE SODIUM 40 MG PO TBEC
40.0000 mg | DELAYED_RELEASE_TABLET | Freq: Every day | ORAL | 0 refills | Status: DC
  Filled 2023-03-02: qty 45, 45d supply, fill #0

## 2023-03-02 MED ORDER — SUGAMMADEX SODIUM 200 MG/2ML IV SOLN
INTRAVENOUS | Status: DC | PRN
  Administered 2023-03-02: 200 mg via INTRAVENOUS

## 2023-03-02 MED ORDER — EPHEDRINE SULFATE-NACL 50-0.9 MG/10ML-% IV SOSY
PREFILLED_SYRINGE | INTRAVENOUS | Status: DC | PRN
  Administered 2023-03-02: 5 mg via INTRAVENOUS
  Administered 2023-03-02: 10 mg via INTRAVENOUS

## 2023-03-02 MED ORDER — SODIUM CHLORIDE 0.9% FLUSH: 3.0000 mL | Freq: Two times a day (BID) | INTRAVENOUS | Status: DC

## 2023-03-02 MED ORDER — HEPARIN SODIUM (PORCINE) 1000 UNIT/ML IJ SOLN
INTRAMUSCULAR | Status: DC | PRN
  Administered 2023-03-02: 1000 [IU] via INTRAVENOUS

## 2023-03-02 MED ORDER — HEPARIN (PORCINE) IN NACL 1000-0.9 UT/500ML-% IV SOLN
INTRAVENOUS | Status: DC | PRN
  Administered 2023-03-02 (×3): 500 mL

## 2023-03-02 MED ORDER — PHENYLEPHRINE HCL-NACL 20-0.9 MG/250ML-% IV SOLN
INTRAVENOUS | Status: DC | PRN
  Administered 2023-03-02: 25 ug/min via INTRAVENOUS

## 2023-03-02 MED ORDER — LIDOCAINE 2% (20 MG/ML) 5 ML SYRINGE
INTRAMUSCULAR | Status: DC | PRN
  Administered 2023-03-02: 50 mg via INTRAVENOUS

## 2023-03-02 MED ORDER — HEPARIN SODIUM (PORCINE) 1000 UNIT/ML IJ SOLN
INTRAMUSCULAR | Status: DC | PRN
  Administered 2023-03-02: 5000 [IU] via INTRAVENOUS
  Administered 2023-03-02: 11000 [IU] via INTRAVENOUS
  Administered 2023-03-02: 3000 [IU] via INTRAVENOUS

## 2023-03-02 MED ORDER — COLCHICINE 0.6 MG PO TABS
0.6000 mg | ORAL_TABLET | Freq: Two times a day (BID) | ORAL | 0 refills | Status: DC
  Filled 2023-03-02: qty 10, 5d supply, fill #0

## 2023-03-02 MED ORDER — PROPOFOL 10 MG/ML IV BOLUS
INTRAVENOUS | Status: DC | PRN
  Administered 2023-03-02: 120 mg via INTRAVENOUS

## 2023-03-02 MED ORDER — HEPARIN SODIUM (PORCINE) 1000 UNIT/ML IJ SOLN
INTRAMUSCULAR | Status: AC
  Filled 2023-03-02: qty 10

## 2023-03-02 MED ORDER — COLCHICINE 0.6 MG PO TABS
0.6000 mg | ORAL_TABLET | Freq: Two times a day (BID) | ORAL | Status: DC
  Administered 2023-03-02: 0.6 mg via ORAL
  Filled 2023-03-02 (×2): qty 1

## 2023-03-02 MED ORDER — SODIUM CHLORIDE 0.9 % IV SOLN: 250.0000 mL | INTRAVENOUS | Status: DC | PRN

## 2023-03-02 MED ORDER — SODIUM CHLORIDE 0.9 % IV SOLN: INTRAVENOUS | Status: DC

## 2023-03-02 MED ORDER — FENTANYL CITRATE (PF) 100 MCG/2ML IJ SOLN
INTRAMUSCULAR | Status: DC | PRN
  Administered 2023-03-02: 100 ug via INTRAVENOUS

## 2023-03-02 MED ORDER — ONDANSETRON HCL 4 MG/2ML IJ SOLN
INTRAMUSCULAR | Status: DC | PRN
  Administered 2023-03-02: 4 mg via INTRAVENOUS

## 2023-03-02 MED ORDER — PROTAMINE SULFATE 10 MG/ML IV SOLN
INTRAVENOUS | Status: DC | PRN
  Administered 2023-03-02: 30 mg via INTRAVENOUS

## 2023-03-02 MED ORDER — PANTOPRAZOLE SODIUM 40 MG PO TBEC
40.0000 mg | DELAYED_RELEASE_TABLET | Freq: Every day | ORAL | Status: DC
  Administered 2023-03-02: 40 mg via ORAL
  Filled 2023-03-02 (×2): qty 1

## 2023-03-02 MED ORDER — DEXAMETHASONE SODIUM PHOSPHATE 10 MG/ML IJ SOLN
INTRAMUSCULAR | Status: DC | PRN
  Administered 2023-03-02: 10 mg via INTRAVENOUS

## 2023-03-02 MED ORDER — ROCURONIUM BROMIDE 10 MG/ML (PF) SYRINGE
PREFILLED_SYRINGE | INTRAVENOUS | Status: DC | PRN
  Administered 2023-03-02: 60 mg via INTRAVENOUS

## 2023-03-02 SURGICAL SUPPLY — 20 items
BAG SNAP BAND KOVER 36X36 (MISCELLANEOUS) IMPLANT
BLANKET WARM UNDERBOD FULL ACC (MISCELLANEOUS) ×1 IMPLANT
CATH 8FR REPROCESSED SOUNDSTAR (CATHETERS) ×1 IMPLANT
CATH 8FR SOUNDSTAR REPROCESSED (CATHETERS) IMPLANT
CATH ABLAT QDOT MICRO BI TC FJ (CATHETERS) IMPLANT
CATH OCTARAY 2.0 F 3-3-3-3-3 (CATHETERS) IMPLANT
CATH S-M CIRCA TEMP PROBE (CATHETERS) IMPLANT
CATH WEBSTER BI DIR CS D-F CRV (CATHETERS) IMPLANT
CLOSURE PERCLOSE PROSTYLE (VASCULAR PRODUCTS) IMPLANT
COVER SWIFTLINK CONNECTOR (BAG) ×1 IMPLANT
PACK EP LATEX FREE (CUSTOM PROCEDURE TRAY) ×1
PACK EP LF (CUSTOM PROCEDURE TRAY) ×1 IMPLANT
PAD DEFIB RADIO PHYSIO CONN (PAD) ×1 IMPLANT
PATCH CARTO3 (PAD) IMPLANT
SHEATH BAYLIS TRANSSEPTAL 98CM (NEEDLE) IMPLANT
SHEATH CARTO VIZIGO SM CVD (SHEATH) IMPLANT
SHEATH PINNACLE 8F 10CM (SHEATH) IMPLANT
SHEATH PINNACLE 9F 10CM (SHEATH) IMPLANT
SHEATH PROBE COVER 6X72 (BAG) IMPLANT
TUBING SMART ABLATE COOLFLOW (TUBING) IMPLANT

## 2023-03-02 NOTE — Transfer of Care (Signed)
Immediate Anesthesia Transfer of Care Note  Patient: Kathy Holt  Procedure(s) Performed: ATRIAL FIBRILLATION ABLATION  Patient Location: Cath Lab  Anesthesia Type:General  Level of Consciousness: awake, alert , and oriented  Airway & Oxygen Therapy: Patient Spontanous Breathing and Patient connected to nasal cannula oxygen  Post-op Assessment: Report given to RN and Post -op Vital signs reviewed and stable  Post vital signs: Reviewed and stable  Last Vitals:  Vitals Value Taken Time  BP 114/69 03/02/23 1007  Temp 36.6 C 03/02/23 1007  Pulse 59 03/02/23 1009  Resp 17 03/02/23 1009  SpO2 98 % 03/02/23 1009  Vitals shown include unvalidated device data.  Last Pain:  Vitals:   03/02/23 1007  TempSrc: Temporal  PainSc: 0-No pain         Complications: There were no known notable events for this encounter.

## 2023-03-02 NOTE — Discharge Instructions (Signed)

## 2023-03-02 NOTE — Anesthesia Procedure Notes (Signed)
Procedure Name: Intubation Date/Time: 03/02/2023 7:48 AM  Performed by: Dorthea Cove, CRNAPre-anesthesia Checklist: Patient identified, Emergency Drugs available, Suction available and Patient being monitored Patient Re-evaluated:Patient Re-evaluated prior to induction Oxygen Delivery Method: Circle system utilized Preoxygenation: Pre-oxygenation with 100% oxygen Induction Type: IV induction Ventilation: Mask ventilation without difficulty Laryngoscope Size: Mac and 3 Grade View: Grade I Tube type: Oral Number of attempts: 1 Airway Equipment and Method: Stylet and Oral airway Placement Confirmation: ETT inserted through vocal cords under direct vision, positive ETCO2 and breath sounds checked- equal and bilateral Secured at: 21 cm Tube secured with: Tape Dental Injury: Teeth and Oropharynx as per pre-operative assessment

## 2023-03-02 NOTE — Anesthesia Postprocedure Evaluation (Signed)
Anesthesia Post Note  Patient: Kathy Holt  Procedure(s) Performed: ATRIAL FIBRILLATION ABLATION     Patient location during evaluation: PACU Anesthesia Type: General Level of consciousness: awake and alert Pain management: pain level controlled Vital Signs Assessment: post-procedure vital signs reviewed and stable Respiratory status: spontaneous breathing, nonlabored ventilation, respiratory function stable and patient connected to nasal cannula oxygen Cardiovascular status: blood pressure returned to baseline and stable Postop Assessment: no apparent nausea or vomiting Anesthetic complications: no   There were no known notable events for this encounter.  Last Vitals:  Vitals:   03/02/23 1025 03/02/23 1043  BP:  111/70  Pulse: (!) 57   Resp: 15   Temp:  (!) 36.4 C  SpO2: 97%     Last Pain:  Vitals:   03/02/23 1043  TempSrc: Temporal  PainSc:                  [name] S

## 2023-03-02 NOTE — Interval H&P Note (Signed)
History and Physical Interval Note:  03/02/2023 7:05 AM  Kathy Holt  has presented today for surgery, with the diagnosis of afib.  The various methods of treatment have been discussed with the patient and family. After consideration of risks, benefits and other options for treatment, the patient has consented to  Procedure(s): ATRIAL FIBRILLATION ABLATION (N/A) as a surgical intervention.  The patient's history has been reviewed, patient examined, no change in status, stable for surgery.  I have reviewed the patient's chart and labs.  Questions were answered to the patient's satisfaction.     Brondon Wann T Kodie Kishi

## 2023-03-02 NOTE — Anesthesia Preprocedure Evaluation (Signed)
Anesthesia Evaluation  Patient identified by MRN, date of birth, ID band Patient awake    Reviewed: Allergy & Precautions, H&P , NPO status , Patient's Chart, lab work & pertinent test results  Airway Mallampati: II   Neck ROM: full    Dental   Pulmonary neg pulmonary ROS   breath sounds clear to auscultation       Cardiovascular + dysrhythmias Atrial Fibrillation  Rhythm:irregular Rate:Normal     Neuro/Psych  PSYCHIATRIC DISORDERS Anxiety        GI/Hepatic   Endo/Other    Renal/GU      Musculoskeletal  (+) Arthritis ,    Abdominal   Peds  Hematology   Anesthesia Other Findings   Reproductive/Obstetrics                             Anesthesia Physical Anesthesia Plan  ASA: 3  Anesthesia Plan: General   Post-op Pain Management:    Induction: Intravenous  PONV Risk Score and Plan: 3 and Ondansetron, Dexamethasone and Treatment may vary due to age or medical condition  Airway Management Planned: Oral ETT  Additional Equipment:   Intra-op Plan:   Post-operative Plan: Extubation in OR  Informed Consent: I have reviewed the patients History and Physical, chart, labs and discussed the procedure including the risks, benefits and alternatives for the proposed anesthesia with the patient or authorized representative who has indicated his/her understanding and acceptance.     Dental advisory given  Plan Discussed with: CRNA, Anesthesiologist and Surgeon  Anesthesia Plan Comments:        Anesthesia Quick Evaluation

## 2023-03-03 ENCOUNTER — Encounter (HOSPITAL_COMMUNITY): Payer: Self-pay | Admitting: Cardiology

## 2023-04-01 ENCOUNTER — Encounter (HOSPITAL_COMMUNITY): Payer: Self-pay | Admitting: Physician Assistant

## 2023-04-01 ENCOUNTER — Ambulatory Visit (HOSPITAL_COMMUNITY)
Admission: RE | Admit: 2023-04-01 | Discharge: 2023-04-01 | Disposition: A | Discharge status: Home / Self Care | Payer: BC Managed Care – PPO | Source: Ambulatory Visit | Attending: Cardiology | Admitting: Cardiology

## 2023-04-01 ENCOUNTER — Ambulatory Visit: Payer: BC Managed Care – PPO | Admitting: Cardiology

## 2023-04-01 VITALS — BP 134/88 | HR 51 | Ht 64.0 in | Wt 156.0 lb

## 2023-04-01 DIAGNOSIS — Z79899 Other long term (current) drug therapy: Secondary | ICD-10-CM | POA: Diagnosis not present

## 2023-04-01 DIAGNOSIS — Z5181 Encounter for therapeutic drug level monitoring: Secondary | ICD-10-CM | POA: Diagnosis not present

## 2023-04-01 DIAGNOSIS — Z7901 Long term (current) use of anticoagulants: Secondary | ICD-10-CM | POA: Insufficient documentation

## 2023-04-01 DIAGNOSIS — I4819 Other persistent atrial fibrillation: Secondary | ICD-10-CM | POA: Diagnosis not present

## 2023-04-01 NOTE — Progress Notes (Signed)
Primary Care Physician: Berniece Salines, FNP Primary Cardiologist: Dr Azucena Cecil Primary Electrophysiologist: Dr Lalla Brothers Referring Physician: Dr Isla Pence is a 72 y.o. female with a history of atrial fibrillation who presents for follow up in the [facility].  The patient was initially diagnosed with atrial fibrillation in 2019 after presenting for a colonoscopy. Patient is on Xarelto for a CHADS2VASC score of 2. She has continued to have paroxysms of afib since. She underwent DCCV on 10/30/22 but had quick return of afib. She was seen by Dr Lalla Brothers who recommended dofetilide as a bridge to ablation.   Patient is s/p dofetilide loading 2/6-01/23/23. She converted with the medication and did not require DCCV. She is also s/p afib ablation with Dr Lalla Brothers on 03/02/23.  On follow up today, patient reports that she has done well since the ablation. She did have some precordial chest pain which resolved after 3-4 days. She has had some "mini afib" episodes since the ablation but they were <1 hour. She has not had any in the past week.   Today, she denies symptoms of shortness of breath, orthopnea, PND, lower extremity edema, dizziness, presyncope, syncope, snoring, daytime somnolence, bleeding, or neurologic sequela. The patient is tolerating medications without difficulties and is otherwise without complaint today.    Atrial Fibrillation Risk Factors:  she does not have symptoms or diagnosis of sleep apnea. she does not have a history of rheumatic fever.   she has a BMI of Body mass index is 26.78 kg/m.Marland Kitchen Filed Weights   04/01/23 1319  Weight: 70.8 kg    Family History  Problem Relation Age of Onset   Cancer - Lung Mother    Pancreatic cancer Father    Alcoholism Brother      Atrial Fibrillation Management history:  Previous antiarrhythmic drugs: dofetilide  Previous cardioversions: 10/30/22 Previous ablations: 03/02/23 CHADS2VASC  score: 2 Anticoagulation history: Xarelto   Past Medical History:  Diagnosis Date   Arthritis    lower back   DDD (degenerative disc disease), cervical    Dysrhythmia    Afib   Past Surgical History:  Procedure Laterality Date   ABDOMINAL HYSTERECTOMY     partial   ATRIAL FIBRILLATION ABLATION N/A 03/02/2023   Procedure: ATRIAL FIBRILLATION ABLATION;  Surgeon: Lanier Prude, MD;  Location: [facility];  Service: Cardiovascular;  Laterality: N/A;   BLADDER SUSPENSION  1998   CARDIOVERSION N/A 10/30/2022   Procedure: CARDIOVERSION;  Surgeon: Antonieta Iba, MD;  Location: [facility] ORS;  Service: Cardiovascular;  Laterality: N/A;   COLONOSCOPY     KNEE ARTHROSCOPY WITH MEDIAL MENISECTOMY Left 01/30/2021   Procedure: KNEE ARTHROSCOPY WITH PARTIAL MEDIAL/LATERAL MENISECTOMY, PARTIAL SYNOVECTOMY;  Surgeon: Lyndle Herrlich, MD;  Location: [facility] ORS;  Service: Orthopedics;  Laterality: Left;    Current Outpatient Medications  Medication Sig Dispense Refill   acetaminophen (TYLENOL) 500 MG tablet Take 500 mg by mouth every 8 (eight) hours as needed for moderate pain.     bisoprolol (ZEBETA) 5 MG tablet Take 0.5 tablets (2.5 mg total) by mouth daily.     dofetilide (TIKOSYN) 500 MCG capsule Take 1 capsule (500 mcg total) by mouth 2 (two) times daily. 60 capsule 5   fluticasone (FLONASE) 50 MCG/ACT nasal spray Place 1 spray into both nostrils daily as needed for allergies or rhinitis.     MAGNESIUM GLYCINATE PO Take 400 mg by mouth at bedtime.     OVER THE  COUNTER MEDICATION Take 1 tablet by mouth daily. Calcium Algae     pantoprazole (PROTONIX) 40 MG tablet Take 1 tablet (40 mg total) by mouth daily. 45 tablet 0   potassium chloride SA (KLOR-CON M) 20 MEQ tablet Take 1 tablet (20 mEq total) by mouth daily. 30 tablet 5   rivaroxaban (XARELTO) 20 MG TABS tablet Take 1 tablet (20 mg total) by mouth daily with supper. 90 tablet 1   colchicine 0.6 MG tablet Take 1 tablet (0.6 mg total)  by mouth 2 (two) times daily for 5 days. 10 tablet 0   No current facility-administered medications for this encounter.    Allergies  Allergen Reactions   Latex Rash    Contact dermatitis    Social History   Socioeconomic History   Marital status: Married    Spouse name: andrew   Number of children: Not on file   Years of education: Not on file   Highest education level: Not on file  Occupational History   Occupation: run Pensions consultant. at [employer]    Comment: full time  Tobacco Use   Smoking status: Never   Smokeless tobacco: Never   Tobacco comments:    Never smoke 01/20/23  Vaping Use   Vaping Use: Never used  Substance and Sexual Activity   Alcohol use: Never    Comment: not in 20 years   Drug use: Never   Sexual activity: Not Currently  Other Topics Concern   Not on file  Social History Narrative   Patient lives with husband and feels safe in her home.  She still works full time.   Social Determinants of Health   Financial Resource Strain: Low Risk  (10/06/2022)   Overall Financial Resource Strain (CARDIA)    Difficulty of Paying Living Expenses: Not hard at all  Food Insecurity: No Food Insecurity (01/20/2023)   Hunger Vital Sign    Worried About Running Out of Food in the Last Year: Never true    Ran Out of Food in the Last Year: Never true  Transportation Needs: No Transportation Needs (01/20/2023)   PRAPARE - Administrator, Civil Service (Medical): No    Lack of Transportation (Non-Medical): No  Physical Activity: Sufficiently Active (10/06/2022)   Exercise Vital Sign    Days of Exercise per Week: 5 days    Minutes of Exercise per Session: 30 min  Stress: Stress Concern Present (10/06/2022)   [employer] of Occupational Health - Occupational Stress Questionnaire    Feeling of Stress : Very much  Social Connections: Moderately Isolated (10/06/2022)   Social Connection and Isolation Panel [NHANES]    Frequency of Communication  with Friends and Family: Three times a week    Frequency of Social Gatherings with Friends and Family: Three times a week    Attends Religious Services: Never    Active Member of Clubs or Organizations: No    Attends Banker Meetings: Never    Marital Status: Married  Catering manager Violence: Not At Risk (01/20/2023)   Humiliation, Afraid, Rape, and Kick questionnaire    Fear of Current or Ex-Partner: No    Emotionally Abused: No    Physically Abused: No    Sexually Abused: No     ROS- All systems are reviewed and negative except as per the HPI above.  Physical Exam: Vitals:   04/01/23 1319  BP: 134/88  Pulse: (!) 51  Weight: 70.8 kg  Height: 5\' 4"  (1.626 m)  GEN- The patient is a well appearing female, alert and oriented x 3 today.   HEENT-head normocephalic, atraumatic, sclera clear, conjunctiva pink, hearing intact, trachea midline. Lungs- Clear to ausculation bilaterally, normal work of breathing Heart- Regular rate and rhythm, bradycardia, no murmurs, rubs or gallops  GI- soft, NT, ND, + BS Extremities- no clubbing, cyanosis, or edema MS- no significant deformity or atrophy Skin- no rash or lesion Psych- euthymic mood, full affect Neuro- strength and sensation are intact   Wt Readings from Last 3 Encounters:  04/01/23 70.8 kg  03/02/23 68.9 kg  02/25/23 70.6 kg    EKG today demonstrates  SB Vent. rate 51 BPM PR interval 194 ms QRS duration 88 ms QT/QTcB 478/440 ms  Echo 10/20/22 demonstrated   1. Left ventricular ejection fraction, by estimation, is 40 to 45%. (50% per Dr Azucena Cecil review) Left ventricular ejection fraction by 2D MOD biplane is 41.2 %. The left ventricle has mildly decreased function. The left ventricle demonstrates global hypokinesis. Left ventricular diastolic function could not be evaluated.   2. Right ventricular systolic function is normal. The right ventricular  size is normal. There is normal pulmonary artery systolic  pressure. The  estimated right ventricular systolic pressure is 29.4 mmHg.   3. Left atrial size was massively dilated.   4. Right atrial size was massively dilated.   5. The mitral valve is abnormal. Mild mitral valve regurgitation.   6. The aortic valve is tricuspid. Aortic valve regurgitation is not  visualized.   7. The inferior vena cava is normal in size with greater than 50%  respiratory variability, suggesting right atrial pressure of 3 mmHg.   Comparison(s): No prior Echocardiogram.   Epic records are reviewed at length today  CHA2DS2-VASc Score = 2  The patient's score is based upon: CHF History: 0 HTN History: 0 Diabetes History: 0 Stroke History: 0 Vascular Disease History: 0 Age Score: 1 Gender Score: 1       ASSESSMENT AND PLAN: 1. Persistent Atrial Fibrillation (ICD10:  I48.19) The patient's CHA2DS2-VASc score is 2, indicating a 2.2% annual risk of stroke.   S/p dofetilide admission 2/6-01/23/23 S/p afib ablation 03/02/23 Patient in SR today with only very brief episodes.  Continue dofetilide 500 mcg BID. QT stable. Continue Xarelto 20 mg daily with no missed doses for at least 3 months post ablation.  Continue bisoprolol 2.5 mg daily Smart watch for home monitoring.    Follow up with Dr Lalla Brothers as scheduled.    Jorja Loa PA-C Afib Clinic [facility] [address] [place], [place] [address] [phone] 04/01/2023 1:24 PM

## 2023-04-19 ENCOUNTER — Other Ambulatory Visit: Payer: Self-pay | Admitting: Cardiovascular Disease

## 2023-05-25 ENCOUNTER — Other Ambulatory Visit: Payer: Self-pay

## 2023-05-25 MED ORDER — BISOPROLOL FUMARATE 5 MG PO TABS: 5.0000 mg | ORAL_TABLET | Freq: Every day | ORAL | 3 refills | Status: DC

## 2023-05-27 ENCOUNTER — Ambulatory Visit: Payer: Medicare Other | Admitting: Cardiology

## 2023-06-03 ENCOUNTER — Ambulatory Visit: Payer: Medicare Other | Admitting: Cardiology

## 2023-07-13 ENCOUNTER — Other Ambulatory Visit: Payer: Self-pay

## 2023-07-13 ENCOUNTER — Ambulatory Visit: Payer: BC Managed Care – PPO | Admitting: Nurse Practitioner

## 2023-07-13 ENCOUNTER — Encounter: Payer: Self-pay | Admitting: Nurse Practitioner

## 2023-07-13 ENCOUNTER — Other Ambulatory Visit (HOSPITAL_COMMUNITY)
Admission: RE | Admit: 2023-07-13 | Discharge: 2023-07-13 | Disposition: A | Discharge status: Home / Self Care | Payer: BC Managed Care – PPO | Source: Ambulatory Visit | Attending: Nurse Practitioner | Admitting: Nurse Practitioner

## 2023-07-13 VITALS — BP 124/82 | HR 86 | Temp 98.0°F | Resp 16 | Ht 60.75 in | Wt 153.9 lb

## 2023-07-13 DIAGNOSIS — N898 Other specified noninflammatory disorders of vagina: Secondary | ICD-10-CM | POA: Diagnosis present

## 2023-07-13 DIAGNOSIS — M810 Age-related osteoporosis without current pathological fracture: Secondary | ICD-10-CM

## 2023-07-13 DIAGNOSIS — N3001 Acute cystitis with hematuria: Secondary | ICD-10-CM | POA: Diagnosis not present

## 2023-07-13 LAB — POCT URINALYSIS DIPSTICK
Bilirubin, UA: NEGATIVE
Glucose, UA: NEGATIVE
Ketones, UA: NEGATIVE
Nitrite, UA: POSITIVE
Protein, UA: POSITIVE — AB
Spec Grav, UA: 1.02 (ref 1.010–1.025)
Urobilinogen, UA: 0.2 E.U./dL
pH, UA: 5 (ref 5.0–8.0)

## 2023-07-13 MED ORDER — CEPHALEXIN 500 MG PO CAPS: 500.0000 mg | ORAL_CAPSULE | Freq: Two times a day (BID) | ORAL | 0 refills | Status: AC

## 2023-07-13 NOTE — Assessment & Plan Note (Signed)
Will get vitamin d level and PTH, will refer to endocrinology after getting labs.

## 2023-07-13 NOTE — Progress Notes (Signed)
BP 124/82   Pulse 86   Temp 98 F (36.7 C) (Oral)   Resp 16   Ht 5' 0.75" (1.543 m)   Wt 153 lb 14.4 oz (69.8 kg)   LMP  (LMP Unknown)   SpO2 98%   BMI 29.32 kg/m    Subjective:    Patient ID: Kathy Holt, female    DOB: [birth year], 72 y.o.   MRN: [id number]  HPI: Kathy Holt is a 72 y.o. female  Chief Complaint  Patient presents with   Urinary Tract Infection    Frequency, burning, vaginitis, seen at [facility] in [place]   UTI/BV: patient reports that symptoms started three weeks.  She reports she was treated in [place] with nitrofurantoin and OTC BV treatment.  Still no improvement started pivmecillinam. She completed all treatments.  However she is not feeling better. She is still having dysuria, vaginal itching.  She denies any fever.  She denies any fever. Urine dip results below.  Vaginal swab collected.  Will start keflex.   Send urine for culture.   Urinalysis    Component Value Date/Time   BILIRUBINUR negative 07/13/2023 0951   PROTEINUR Positive (A) 07/13/2023 0951   UROBILINOGEN 0.2 07/13/2023 0951   NITRITE positive 07/13/2023 0951   LEUKOCYTESUR Large (3+) (A) 07/13/2023 0951   Osteoporosis: patient was previously on fosamax, but did not tolerate it. She says she would have near syncopal episodes after taking medication. Will get vitamin d and PTH.  Will refer to endocrinology after getting labs. Patient is currently taking vitamin d and calcium supplement.  Continue weight bearing exercises.   EXAM: DUAL X-RAY ABSORPTIOMETRY (DXA) FOR BONE MINERAL DENSITY   IMPRESSION: Your patient Nerissa Starr completed a BMD test on 02/12/2023 using the [employer] iDXA DXA System (software version: 14.10) manufactured by [employer]. The following summarizes the results of our evaluation. Technologist:VLM PATIENT BIOGRAPHICAL: Name: Kathy, Holt Patient ID: [id number] Birth Date: 15-Dec-1951 Height: 64.0 in. Gender: Female Exam Date:  02/12/2023 Weight: 154.0 lbs. Indications: Caucasian, Hysterectomy Fractures: Treatments: calcium w/ vit D DENSITOMETRY RESULTS: Site      Region    Measured Date Measured Age WHO Classification Young Adult T-score BMD         %Change vs. Previous Significant Change (*) AP Spine L1-L4 02/12/2023 70.3 Osteoporosis -2.6 0.868 g/cm2 - -   DualFemur Neck Left 02/12/2023 70.3 Osteopenia -2.4 0.707 g/cm2 - - ASSESSMENT: The BMD measured at AP Spine L1-L4 is 0.868 g/cm2 with a T-score of -2.6. This patient is considered osteoporotic according to World Health Organization [facility]) criteria. The scan quality is good.   World Science writer [name]) criteria for post-menopausal, Caucasian Women: Normal:                   T-score at or above -1 SD Osteopenia/low bone mass: T-score between -1 and -2.5 SD Osteoporosis:             T-score at or below -2.5 SD   RECOMMENDATIONS: 1. All patients should optimize calcium and vitamin D intake. 2. Consider FDA-approved medical therapies in postmenopausal women and men aged 47 years and older, based on the following: a. A hip or vertebral(clinical or morphometric) fracture b. T-score < -2.5 at the femoral neck or spine after appropriate evaluation to exclude secondary causes c. Low bone mass (T-score between -1.0 and -2.5 at the femoral neck or spine) and a 10-year probability of a hip fracture > 3% or a 10-year  probability of a major osteoporosis-related fracture > 20% based on the [place]-adapted WHO algorithm 3. Clinician judgment and/or patient preferences may indicate treatment for people with 10-year fracture probabilities above or below these levels FOLLOW-UP: People with diagnosed cases of osteoporosis or at high risk for fracture should have regular bone mineral density tests. For patients eligible for Medicare, routine testing is allowed once every 2 years. The testing frequency can be increased to one year for patients who have rapidly  progressing disease, those who are receiving or discontinuing medical therapy to restore bone mass, or have additional risk factors.   I have reviewed this report, and agree with the above findings.   [facility] Relevant past medical, surgical, family and social history reviewed and updated as indicated. Interim medical history since our last visit reviewed. Allergies and medications reviewed and updated.  Review of Systems  Constitutional: Negative for fever or weight change.  Respiratory: Negative for cough and shortness of breath.   Cardiovascular: Negative for chest pain or palpitations.  Gastrointestinal: Negative for abdominal pain, no bowel changes. Positive for dysuria, vaginal itching Musculoskeletal: Negative for gait problem or joint swelling.  Skin: Negative for rash.  Neurological: Negative for dizziness or headache.  No other specific complaints in a complete review of systems (except as listed in HPI above).      Objective:    BP 124/82   Pulse 86   Temp 98 F (36.7 C) (Oral)   Resp 16   Ht 5' 0.75" (1.543 m)   Wt 153 lb 14.4 oz (69.8 kg)   LMP  (LMP Unknown)   SpO2 98%   BMI 29.32 kg/m   Wt Readings from Last 3 Encounters:  07/13/23 153 lb 14.4 oz (69.8 kg)  04/01/23 156 lb (70.8 kg)  03/02/23 152 lb (68.9 kg)    Physical Exam  Constitutional: Patient appears well-developed and well-nourished.  No distress.  HEENT: head atraumatic, normocephalic, pupils equal and reactive to light, neck supple, throat within normal limits Cardiovascular: Normal rate, regular rhythm and normal heart sounds.  No murmur heard. No BLE edema. Pulmonary/Chest: Effort normal and breath sounds normal. No respiratory distress. Abdominal: Soft.  There is no tenderness. No CVA tenderness Psychiatric: Patient has a normal mood and affect. behavior is normal. Judgment and thought content normal.  Results for orders placed or performed in visit on 07/13/23  POCT  urinalysis dipstick  Result Value Ref Range   Color, UA gold    Clarity, UA cloudy    Glucose, UA Negative Negative   Bilirubin, UA negative    Ketones, UA negative    Spec Grav, UA 1.020 1.010 - 1.025   Blood, UA large    pH, UA 5.0 5.0 - 8.0   Protein, UA Positive (A) Negative   Urobilinogen, UA 0.2 0.2 or 1.0 E.U./dL   Nitrite, UA positive    Leukocytes, UA Large (3+) (A) Negative   Appearance cloudy    Odor none       Assessment & Plan:   Problem List Items Addressed This Visit       Musculoskeletal and Integument   Age-related osteoporosis without current pathological fracture    Will get vitamin d level and PTH, will refer to endocrinology after getting labs.       Relevant Orders   VITAMIN D 25 Hydroxy (Vit-D Deficiency, Fractures)   PTH, intact and calcium   Other Visit Diagnoses     Acute cystitis with hematuria    -  Primary   start keflex, push fluids, send urine for culture   Relevant Medications   cephALEXin (KEFLEX) 500 MG capsule   Other Relevant Orders   Urine Culture   POCT urinalysis dipstick (Completed)   Vaginal discharge       vaginal swab collected   Relevant Orders   Cervicovaginal ancillary only        Follow up plan: Return if symptoms worsen or fail to improve.

## 2023-07-14 ENCOUNTER — Other Ambulatory Visit: Payer: Self-pay | Admitting: Nurse Practitioner

## 2023-07-14 DIAGNOSIS — A5901 Trichomonal vulvovaginitis: Secondary | ICD-10-CM

## 2023-07-14 MED ORDER — METRONIDAZOLE 500 MG PO TABS: 500.0000 mg | ORAL_TABLET | Freq: Two times a day (BID) | ORAL | 0 refills | Status: DC

## 2023-07-14 NOTE — Progress Notes (Unsigned)
  Electrophysiology Office Follow up Visit Note:    Date:  07/15/2023   ID:  Kathy Holt, DOB [birth year], MRN [id number]  PCP:  Berniece Salines, FNP  [facility] Cardiologist:  None  [facility] Electrophysiologist:  Lanier Prude, MD    Interval History:    Kathy Holt is a 72 y.o. female who presents for a follow up visit.   She had an atrial fibrillation ablation on March 02, 2023.  During the procedure, the veins and posterior wall were isolated.  She saw Clide Cliff in the A-fib clinic on April 01, 2023.  At that appointment she reported no sustained episodes of atrial fibrillation.  She does take Tikosyn 500 mcg by mouth twice daily.  She is also on Xarelto for stroke prophylaxis.      Past medical, surgical, social and family history were reviewed.  ROS:   Please see the history of present illness.    All other systems reviewed and are negative.  EKGs/Labs/Other Studies Reviewed:    The following studies were reviewed today:  EKG Interpretation Date/Time:  Wednesday July 15 2023 09:58:33 EDT Ventricular Rate:  53 PR Interval:  210 QRS Duration:  78 QT Interval:  474 QTC Calculation: 444 R Axis:   44  Text Interpretation: Sinus bradycardia with 1st degree A-V block Low voltage QRS Confirmed by Steffanie Dunn [phone]) on 07/15/2023 10:01:47 AM    Physical Exam:    VS:  BP (!) 140/86 (BP Location: Left Arm, Patient Position: Sitting, Cuff Size: Normal)   Pulse (!) 53   Ht 5' 0.75" (1.543 m)   Wt 154 lb (69.9 kg)   LMP  (LMP Unknown)   SpO2 98%   BMI 29.34 kg/m     Wt Readings from Last 3 Encounters:  07/15/23 154 lb (69.9 kg)  07/13/23 153 lb 14.4 oz (69.8 kg)  04/01/23 156 lb (70.8 kg)     GEN:  Well nourished, well developed in no acute distress CARDIAC: RRR, no murmurs, rubs, gallops RESPIRATORY:  Clear to auscultation without rales, wheezing or rhonchi       ASSESSMENT:    1. Persistent atrial fibrillation (HCC)   2.  Encounter for long-term (current) use of high-risk medication   3. Primary hypertension    PLAN:    In order of problems listed above:  #Persistent atrial fibrillation #High risk drug monitoring-dofetilide The patient is doing well after her March ablation.  She continues to take Tikosyn and Xarelto. QTc acceptable for ongoing Tikosyn use. Repeat BMP and magnesium today.  #Hypertension At goal today.  Recommend checking blood pressures 1-2 times per week at home and recording the values.  Recommend bringing these recordings to the primary care physician.   Follow-up 4 months with APP.      Signed, Steffanie Dunn, MD, [facility] 07/15/2023 10:05 AM    Electrophysiology [facility]

## 2023-07-15 ENCOUNTER — Encounter: Payer: Self-pay | Admitting: Cardiology

## 2023-07-15 ENCOUNTER — Ambulatory Visit: Payer: BC Managed Care – PPO | Attending: Cardiology | Admitting: Cardiology

## 2023-07-15 VITALS — BP 140/86 | HR 53 | Ht 60.75 in | Wt 154.0 lb

## 2023-07-15 DIAGNOSIS — I10 Essential (primary) hypertension: Secondary | ICD-10-CM | POA: Diagnosis not present

## 2023-07-15 DIAGNOSIS — Z79899 Other long term (current) drug therapy: Secondary | ICD-10-CM

## 2023-07-15 DIAGNOSIS — I4819 Other persistent atrial fibrillation: Secondary | ICD-10-CM

## 2023-07-15 NOTE — Patient Instructions (Signed)
Medication Instructions:  The current medical regimen is effective;  continue present plan and medications.  *If you need a refill on your cardiac medications before your next appointment, please call your pharmacy*   Lab Work: Your provider would like for you to have following labs drawn today BMET, MAG.   If you have labs (blood work) drawn today and your tests are completely normal, you will receive your results only by: MyChart Message (if you have MyChart) OR A paper copy in the mail If you have any lab test that is abnormal or we need to change your treatment, we will call you to review the results.   Follow-Up: At [facility], you and your health needs are our priority.  As part of our continuing mission to provide you with exceptional heart care, we have created designated Provider Care Teams.  These Care Teams include your primary Cardiologist (physician) and Advanced Practice Providers (APPs -  Physician Assistants and Nurse Practitioners) who all work together to provide you with the care you need, when you need it.  We recommend signing up for the patient portal called "MyChart".  Sign up information is provided on this After Visit Summary.  MyChart is used to connect with patients for Virtual Visits (Telemedicine).  Patients are able to view lab/test results, encounter notes, upcoming appointments, etc.  Non-urgent messages can be sent to your provider as well.   To learn more about what you can do with MyChart, go to [email or web address].    Your next appointment:   4 month(s)  Provider:   You will see one of the following Advanced Practice Providers on your designated Care Team:    Sherie Don, NP

## 2023-07-16 ENCOUNTER — Other Ambulatory Visit: Payer: Self-pay | Admitting: Nurse Practitioner

## 2023-07-16 DIAGNOSIS — I4891 Unspecified atrial fibrillation: Secondary | ICD-10-CM

## 2023-07-17 NOTE — Telephone Encounter (Signed)
Requested Prescriptions  Pending Prescriptions Disp Refills   rivaroxaban (XARELTO) 20 MG TABS tablet [Pharmacy Med Name: XARELTO 20 MG TABLET] 90 tablet 0    Sig: TAKE 1 TABLET BY MOUTH DAILY WITH SUPPER     Hematology: Anticoagulants - rivaroxaban Passed - 07/16/2023  6:48 PM      Passed - ALT in normal range and within 360 days    ALT  Date Value Ref Range Status  10/06/2022 16 6 - 29 U/L Final         Passed - AST in normal range and within 360 days    AST  Date Value Ref Range Status  10/06/2022 17 10 - 35 U/L Final         Passed - Cr in normal range and within 360 days    Creat  Date Value Ref Range Status  10/06/2022 0.73 0.60 - 1.00 mg/dL Final   Creatinine, Ser  Date Value Ref Range Status  07/15/2023 0.71 0.57 - 1.00 mg/dL Final         Passed - HCT in normal range and within 360 days    HCT  Date Value Ref Range Status  02/24/2023 41.9 36.0 - 46.0 % Final         Passed - HGB in normal range and within 360 days    Hemoglobin  Date Value Ref Range Status  02/24/2023 14.2 12.0 - 15.0 g/dL Final         Passed - PLT in normal range and within 360 days    Platelets  Date Value Ref Range Status  02/24/2023 186 150 - 400 K/uL Final         Passed - eGFR is 15 or above and within 360 days    GFR, Estimated  Date Value Ref Range Status  02/24/2023 >60 >60 mL/min Final    Comment:    (NOTE) Calculated using the CKD-EPI Creatinine Equation (2021)    eGFR  Date Value Ref Range Status  07/15/2023 91 >59 mL/min/1.73 Final         Passed - Patient is not pregnant      Passed - Valid encounter within last 12 months    Recent Outpatient Visits           4 days ago Acute cystitis with hematuria   [facility] Berniece Salines, FNP   6 months ago Atrial fibrillation, unspecified type [facility])   [facility] Berniece Salines, FNP   9 months ago Atrial fibrillation, unspecified type [facility])   [facility]  [facility] Berniece Salines, FNP       Future Appointments             In 4 days Zane Herald, Rudolpho Sevin, FNP [facility]   In 4 months Sherie Don, NP [facility] at [facility]

## 2023-07-20 ENCOUNTER — Ambulatory Visit: Payer: BC Managed Care – PPO | Admitting: Nurse Practitioner

## 2023-07-20 NOTE — Progress Notes (Unsigned)
LMP  (LMP Unknown)    Subjective:    Patient ID: Kathy Holt, female    DOB: 27-Dec-1950, 72 y.o.   MRN: [id number]  HPI: Kathy Holt is a 72 y.o. female  No chief complaint on file.  Sinusitis: she says she has had symptoms for about six weeks. She says that she has increased mucous and is causing soreness and pressure. She says it has been draining especially in the middle of the night.  She has done nasal wash, flonase, zyrtec. Recommend taking zyrtec, flonase, mucinex.  Will treat with Augmentin.   Anxiety/stress/insomnia: Reports that she cares for her husband who has had some vaginal cancer.  He had a balloon dilation every 2 weeks.  He then had an esophagectomy and had his vocal cords were damaged.  He is having trouble eating.  She says she has not been taking trazodone 25 mg at night to help her sleep, she says she has not needed it.  She says she will occasionally take Valium at night every once in awhile. Patient reports she is doing much better, her husband is doing better and she is back at work which has helped. She did try the buspar but did not like it and has not been taking.       07/13/2023    9:46 AM 01/09/2023    7:58 AM 10/06/2022    2:04 PM 10/06/2022   12:56 PM  Depression screen PHQ 2/9  Decreased Interest 0 0 0 0  Down, Depressed, Hopeless 0 0 0 0  PHQ - 2 Score 0 0 0 0  Altered sleeping  1 2 0  Tired, decreased energy  1 1 0  Change in appetite  0 0 0  Feeling bad or failure about yourself   0 0 0  Trouble concentrating  0 0 0  Moving slowly or fidgety/restless  0 0 0  Suicidal thoughts  0 0 0  PHQ-9 Score  2 3 0  Difficult doing work/chores  Not difficult at all Not difficult at all Not difficult at all       01/09/2023    8:01 AM 10/06/2022    2:04 PM  GAD 7 : Generalized Anxiety Score  Nervous, Anxious, on Edge 0 1  Control/stop worrying 0 1  Worry too much - different things 0 1  Trouble relaxing 0 1  Restless 0 0  Easily  annoyed or irritable 0 0  Afraid - awful might happen 0 1  Total GAD 7 Score 0 5  Anxiety Difficulty Not difficult at all Not difficult at all    Atrial fibrillation: Diagnosed back in 2020.  She was getting ready to have a colonoscopy and they said that she was in A-fib.  She is currently taking Xarelto 20 mg daily and bisoprolol 2.5 mg daily.   She says it does come and go.  Visit we did refer her to cardiology.she was seen by cardiology on 10/09/2022. She saw Dr. Azucena Cecil. She says they are going to start her on Tikosyn but she has to be hospitalized for four days to start treatment.  she does have an ablation scheduled in March.  Echo 10/20/22  1. Left ventricular ejection fraction, by estimation, is 40 to 45%. Left  ventricular ejection fraction by 2D MOD biplane is 41.2 %. The left  ventricle has mildly decreased function. The left ventricle demonstrates  global hypokinesis. Left ventricular  diastolic function could not be evaluated.   2.  Right ventricular systolic function is normal. The right ventricular  size is normal. There is normal pulmonary artery systolic pressure. The  estimated right ventricular systolic pressure is 29.4 mmHg.   3. Left atrial size was massively dilated.   4. Right atrial size was massively dilated.   5. The mitral valve is abnormal. Mild mitral valve regurgitation.   6. The aortic valve is tricuspid. Aortic valve regurgitation is not  visualized.   7. The inferior vena cava is normal in size with greater than 50%  respiratory variability, suggesting right atrial pressure of 3 mmHg.  Arthritis/Low back pain/DDD:  She says she did get injections in her back.   She says that she has not had to see anyone for her back in awhile.  She says she was seen at [facility] clinic. Dr. Landry Mellow sports medicine. She tries to not aggravate her back. She reports she is doing okay.   Osteoporosis: She says that she was taking fosamax, but she was not feeling well when  she took it.  She says her previous provider referred her to endocrinology but she was never seen.  She is currently taking calcium, and vitamin d 5000u daily.  Bone density done two years ago.  New bone density at last appointment.  Patient scheduled to have it done next month.  If no improvement will send to endocrinology. Bone density showed: Lumbar spine -3.4, hip -2.4, Frax 2.8 10-year risk of hip fracture   Relevant past medical, surgical, family and social history reviewed and updated as indicated. Interim medical history since our last visit reviewed. Allergies and medications reviewed and updated.  Review of Systems  Constitutional: Negative for fever or weight change.  Respiratory: Negative for cough and shortness of breath.   Cardiovascular: Negative for chest pain or palpitations.  Gastrointestinal: Negative for abdominal pain, no bowel changes.  Musculoskeletal: Negative for gait problem or joint swelling.  Skin: Negative for rash.  Neurological: Negative for dizziness or headache.  No other specific complaints in a complete review of systems (except as listed in HPI above).      Objective:    LMP  (LMP Unknown)   Wt Readings from Last 3 Encounters:  07/15/23 154 lb (69.9 kg)  07/13/23 153 lb 14.4 oz (69.8 kg)  04/01/23 156 lb (70.8 kg)    Physical Exam  Constitutional: Patient appears well-developed and well-nourished.  No distress.  HEENT: head atraumatic, normocephalic, pupils equal and reactive to light,  neck supple Cardiovascular: Normal rate, irregular rhythm and normal heart sounds.  No murmur heard. No BLE edema. Pulmonary/Chest: Effort normal and breath sounds normal. No respiratory distress. Abdominal: Soft.  There is no tenderness. Psychiatric: Patient has a normal mood and affect. behavior is normal. Judgment and thought content normal.     Assessment & Plan:   Problem List Items Addressed This Visit   None     Follow up plan: No follow-ups on  file.

## 2023-07-21 ENCOUNTER — Other Ambulatory Visit (HOSPITAL_COMMUNITY)
Admission: RE | Admit: 2023-07-21 | Discharge: 2023-07-21 | Disposition: A | Discharge status: Home / Self Care | Payer: BC Managed Care – PPO | Source: Ambulatory Visit | Attending: Nurse Practitioner | Admitting: Nurse Practitioner

## 2023-07-21 ENCOUNTER — Other Ambulatory Visit: Payer: Self-pay

## 2023-07-21 ENCOUNTER — Encounter: Payer: Self-pay | Admitting: Nurse Practitioner

## 2023-07-21 ENCOUNTER — Ambulatory Visit: Payer: BC Managed Care – PPO | Admitting: Nurse Practitioner

## 2023-07-21 VITALS — BP 124/72 | HR 77 | Temp 98.1°F | Resp 16 | Ht 60.75 in | Wt 153.7 lb

## 2023-07-21 DIAGNOSIS — L292 Pruritus vulvae: Secondary | ICD-10-CM | POA: Insufficient documentation

## 2023-07-21 DIAGNOSIS — M810 Age-related osteoporosis without current pathological fracture: Secondary | ICD-10-CM

## 2023-07-21 DIAGNOSIS — N898 Other specified noninflammatory disorders of vagina: Secondary | ICD-10-CM | POA: Insufficient documentation

## 2023-07-21 DIAGNOSIS — F419 Anxiety disorder, unspecified: Secondary | ICD-10-CM | POA: Diagnosis not present

## 2023-07-21 DIAGNOSIS — I4819 Other persistent atrial fibrillation: Secondary | ICD-10-CM

## 2023-07-21 DIAGNOSIS — Z113 Encounter for screening for infections with a predominantly sexual mode of transmission: Secondary | ICD-10-CM | POA: Insufficient documentation

## 2023-07-21 DIAGNOSIS — B009 Herpesviral infection, unspecified: Secondary | ICD-10-CM | POA: Insufficient documentation

## 2023-07-21 MED ORDER — DIAZEPAM 2 MG PO TABS: 2.0000 mg | ORAL_TABLET | Freq: Two times a day (BID) | ORAL | 1 refills | Status: DC | PRN

## 2023-07-21 MED ORDER — FAMCICLOVIR 500 MG PO TABS: 500.0000 mg | ORAL_TABLET | Freq: Every day | ORAL | 1 refills | Status: DC

## 2023-07-21 NOTE — Assessment & Plan Note (Signed)
Referral placed to endocrinology.

## 2023-07-21 NOTE — Assessment & Plan Note (Signed)
Refill of diazepam 2 mg sent in

## 2023-07-21 NOTE — Assessment & Plan Note (Signed)
Recently had an outbreak and needs a refill of her famciclovir

## 2023-07-21 NOTE — Assessment & Plan Note (Addendum)
Stable, no change, managed by cardiology

## 2023-07-22 ENCOUNTER — Encounter: Payer: Self-pay | Admitting: Nurse Practitioner

## 2023-07-23 ENCOUNTER — Other Ambulatory Visit: Payer: Self-pay | Admitting: Nurse Practitioner

## 2023-07-23 DIAGNOSIS — N898 Other specified noninflammatory disorders of vagina: Secondary | ICD-10-CM

## 2023-07-23 MED ORDER — ESTROGENS CONJUGATED 0.625 MG/GM VA CREA: 1.0000 | TOPICAL_CREAM | VAGINAL | 5 refills | Status: DC

## 2023-07-23 MED ORDER — HYDROCORTISONE 1 % EX CREA: 1.0000 | TOPICAL_CREAM | Freq: Two times a day (BID) | CUTANEOUS | 1 refills | Status: DC

## 2023-07-24 ENCOUNTER — Other Ambulatory Visit: Payer: Self-pay | Admitting: Nurse Practitioner

## 2023-07-24 DIAGNOSIS — N898 Other specified noninflammatory disorders of vagina: Secondary | ICD-10-CM

## 2023-07-24 MED ORDER — ESTROGENS CONJUGATED 0.625 MG/GM VA CREA: 1.0000 | TOPICAL_CREAM | VAGINAL | 5 refills | Status: DC

## 2023-07-27 ENCOUNTER — Other Ambulatory Visit: Payer: Self-pay | Admitting: Nurse Practitioner

## 2023-07-28 ENCOUNTER — Other Ambulatory Visit: Payer: Self-pay | Admitting: Nurse Practitioner

## 2023-07-29 ENCOUNTER — Telehealth: Payer: Self-pay | Admitting: Nurse Practitioner

## 2023-07-29 NOTE — Progress Notes (Unsigned)
   LMP  (LMP Unknown)    Subjective:    Patient ID: CASONYA DELARIVA, female    DOB: Dec 19, 1950, 72 y.o.   MRN: [id number]  HPI: RYIAH DEPAS is a 72 y.o. female  No chief complaint on file.   Relevant past medical, surgical, family and social history reviewed and updated as indicated. Interim medical history since our last visit reviewed. Allergies and medications reviewed and updated.  Review of Systems  Per HPI unless specifically indicated above     Objective:    LMP  (LMP Unknown)   Wt Readings from Last 3 Encounters:  07/21/23 153 lb 11.2 oz (69.7 kg)  07/15/23 154 lb (69.9 kg)  07/13/23 153 lb 14.4 oz (69.8 kg)    Physical Exam  Results for orders placed or performed in visit on 07/21/23  Cervicovaginal ancillary only  Result Value Ref Range   Neisseria Gonorrhea Negative    Chlamydia Negative    Trichomonas Negative    Bacterial Vaginitis (gardnerella) Negative    Candida Vaginitis Negative    Candida Glabrata Negative    Comment      Normal Reference Range Bacterial Vaginosis - Negative   Comment Normal Reference Range Candida Species - Negative    Comment Normal Reference Range Candida Galbrata - Negative    Comment Normal Reference Range Trichomonas - Negative    Comment Normal Reference Ranger Chlamydia - Negative    Comment      Normal Reference Range Neisseria Gonorrhea - Negative      Assessment & Plan:   Problem List Items Addressed This Visit   None    Follow up plan: No follow-ups on file.

## 2023-07-29 NOTE — Telephone Encounter (Signed)
Per Kathy Holt a new medication has been called in

## 2023-07-29 NOTE — Telephone Encounter (Signed)
Medication Refill - Medication: conjugated estrogens (PREMARIN) vaginal cream   Has the patient contacted their pharmacy? Yes.   Per pharmacy Prior Authorization is needed.     Preferred Pharmacy (with phone number or street name): [id number] [place], [place] - [address] AT [facility] OF [address] [place], [place] [address] Phone: [phone]  Fax: [phone]

## 2023-07-30 ENCOUNTER — Encounter: Payer: Self-pay | Admitting: Nurse Practitioner

## 2023-07-30 ENCOUNTER — Ambulatory Visit: Payer: Self-pay | Admitting: *Deleted

## 2023-07-30 ENCOUNTER — Ambulatory Visit (INDEPENDENT_AMBULATORY_CARE_PROVIDER_SITE_OTHER): Payer: BC Managed Care – PPO | Admitting: Nurse Practitioner

## 2023-07-30 ENCOUNTER — Other Ambulatory Visit: Payer: Self-pay

## 2023-07-30 ENCOUNTER — Other Ambulatory Visit: Payer: Self-pay | Admitting: Emergency Medicine

## 2023-07-30 VITALS — BP 120/72 | HR 73 | Temp 98.1°F | Resp 16 | Ht 60.75 in | Wt 154.3 lb

## 2023-07-30 DIAGNOSIS — N898 Other specified noninflammatory disorders of vagina: Secondary | ICD-10-CM

## 2023-07-30 DIAGNOSIS — T300 Burn of unspecified body region, unspecified degree: Secondary | ICD-10-CM | POA: Diagnosis not present

## 2023-07-30 MED ORDER — BACITRACIN ZINC 500 UNIT/GM EX OINT: 1.0000 | TOPICAL_OINTMENT | Freq: Two times a day (BID) | CUTANEOUS | 0 refills | Status: DC

## 2023-07-30 MED ORDER — MAFENIDE ACETATE 85 MG/GM EX CREA: TOPICAL_CREAM | Freq: Every day | CUTANEOUS | 0 refills | Status: DC

## 2023-07-30 MED ORDER — ESTRADIOL 0.1 MG/GM VA CREA: 1.0000 | TOPICAL_CREAM | Freq: Every day | VAGINAL | 12 refills | Status: AC

## 2023-07-30 NOTE — Telephone Encounter (Signed)
  Chief Complaint: [facility] pharmacy needs clarification or specific dose . Previous directions may be excessive amount per pharmacy, Jonny Ruiz Symptoms: na Frequency: na Pertinent Negatives: Patient denies na Disposition: [] ED /[] Urgent Care (no appt availability in office) / [] Appointment(In office/virtual)/ []  [facility]/ [] Home Care/ [] Refused Recommended Disposition /[] [place] [facility]/ [x]  Follow-up with PCP Additional Notes:   Please specify dose 2 gm , 3 gm, or 4 gm . One applicator full may be excessive per pharmacy . Please advise.     Reason for Disposition  [1] Pharmacy calling with prescription question AND [2] triager unable to answer question  Answer Assessment - Initial Assessment Questions 1. NAME of MEDICINE: "What medicine(s) are you calling about?"     Estradiol (estrace) 0.1 mg/gm vaginal cream per pharmacy  2. QUESTION: "What is your question?" (e.g., double dose of medicine, side effect)     Clarification of dose. Directions for 1 applicator ful may be excessive. Please specify dose 2 gm, 3 gm or 4 gm 3. PRESCRIBER: "Who prescribed the medicine?" Reason: if prescribed by specialist, call should be referred to that group.     Norville Haggard, FNP 4. SYMPTOMS: "Do you have any symptoms?" If Yes, ask: "What symptoms are you having?"  "How bad are the symptoms (e.g., mild, moderate, severe)     Na  5. PREGNANCY:  "Is there any chance that you are pregnant?" "When was your last menstrual period?"     na  Protocols used: Medication Question Call-A-AH

## 2023-07-30 NOTE — Telephone Encounter (Signed)
Raynelle Fanning spoke to pharmacy

## 2023-08-17 ENCOUNTER — Other Ambulatory Visit (HOSPITAL_COMMUNITY): Payer: Self-pay | Admitting: Physician Assistant

## 2023-08-17 ENCOUNTER — Other Ambulatory Visit: Payer: Self-pay | Admitting: Cardiology

## 2023-10-16 ENCOUNTER — Other Ambulatory Visit: Payer: Self-pay | Admitting: Nurse Practitioner

## 2023-10-16 DIAGNOSIS — I4891 Unspecified atrial fibrillation: Secondary | ICD-10-CM

## 2023-10-16 NOTE — Telephone Encounter (Signed)
Requested Prescriptions  Pending Prescriptions Disp Refills   XARELTO 20 MG TABS tablet [Pharmacy Med Name: XARELTO 20 MG TABLET] 90 tablet 0    Sig: TAKE 1 TABLET BY MOUTH DAILY WITH SUPPER     Hematology: Anticoagulants - rivaroxaban Failed - 10/16/2023  2:55 PM      Failed - ALT in normal range and within 360 days    ALT  Date Value Ref Range Status  10/06/2022 16 6 - 29 U/L Final         Failed - AST in normal range and within 360 days    AST  Date Value Ref Range Status  10/06/2022 17 10 - 35 U/L Final         Passed - Cr in normal range and within 360 days    Creat  Date Value Ref Range Status  10/06/2022 0.73 0.60 - 1.00 mg/dL Final   Creatinine, Ser  Date Value Ref Range Status  07/15/2023 0.71 0.57 - 1.00 mg/dL Final         Passed - HCT in normal range and within 360 days    HCT  Date Value Ref Range Status  02/24/2023 41.9 36.0 - 46.0 % Final         Passed - HGB in normal range and within 360 days    Hemoglobin  Date Value Ref Range Status  02/24/2023 14.2 12.0 - 15.0 g/dL Final         Passed - PLT in normal range and within 360 days    Platelets  Date Value Ref Range Status  02/24/2023 186 150 - 400 K/uL Final         Passed - eGFR is 15 or above and within 360 days    GFR, Estimated  Date Value Ref Range Status  02/24/2023 >60 >60 mL/min Final    Comment:    (NOTE) Calculated using the CKD-EPI Creatinine Equation (2021)    eGFR  Date Value Ref Range Status  07/15/2023 91 >59 mL/min/1.73 Final         Passed - Patient is not pregnant      Passed - Valid encounter within last 12 months    Recent Outpatient Visits           2 months ago Burn   [facility] Berniece Salines, FNP   2 months ago Age-related osteoporosis without current pathological fracture   [facility] Berniece Salines, FNP   3 months ago Acute cystitis with hematuria   [facility] Berniece Salines, FNP   9 months ago Atrial fibrillation, unspecified type [facility])   [facility] Berniece Salines, FNP   1 year ago Atrial fibrillation, unspecified type [facility])   [facility] Berniece Salines, FNP       Future Appointments             In 1 month Sherie Don, NP [facility] [place]   In 3 months Zane Herald, Rudolpho Sevin, FNP [facility]

## 2023-11-14 ENCOUNTER — Other Ambulatory Visit: Payer: Self-pay | Admitting: Cardiology

## 2023-11-16 ENCOUNTER — Ambulatory Visit: Payer: BC Managed Care – PPO | Admitting: Cardiology

## 2023-11-17 ENCOUNTER — Other Ambulatory Visit: Payer: Self-pay

## 2023-11-17 MED ORDER — DOFETILIDE 500 MCG PO CAPS: 500.0000 ug | ORAL_CAPSULE | Freq: Two times a day (BID) | ORAL | 2 refills | Status: DC

## 2023-11-17 MED ORDER — POTASSIUM CHLORIDE CRYS ER 20 MEQ PO TBCR: 20.0000 meq | EXTENDED_RELEASE_TABLET | Freq: Every day | ORAL | 2 refills | Status: DC

## 2023-11-17 NOTE — Telephone Encounter (Signed)
Requested Prescriptions   Signed Prescriptions Disp Refills   dofetilide (TIKOSYN) 500 MCG capsule 60 capsule 2    Sig: Take 1 capsule (500 mcg total) by mouth 2 (two) times daily.    Authorizing Provider: Lanier Prude    Ordering User: Guerry Minors   Last visit 07/15/23 with plan to f/u in 4 months.    Next visit: 11/20/23

## 2023-11-18 NOTE — Progress Notes (Signed)
Cardiology Clinic Note    Date:  11/20/2023  Patient ID:  Kathy Holt, Kathy Holt Jan 08, 1951, MRN [id number] PCP:  Berniece Salines, FNP  Cardiologist:  None Electrophysiologist: Lanier Prude, MD     Patient Profile    Chief Complaint: 6mon tikosyn follow-up  History of Present Illness: Kathy Holt is a 72 y.o. female with PMH notable for persis AFib, HTN; seen today for Lanier Prude, MD for routine electrophysiology followup.  She is s/p afib ablation 02/2023 with pulmonary veins and posterior wall isolation.  She last saw Dr. Lalla Brothers 06/2023 where she was doing well without reoccurrence of afib, continued tikosyn and xarelto.   On follow-up today, she is doing very well. She has had one episode of palpitations since last being seen. The day of palpitations, she had not slept well the previous night and was traveling, so believes that may be triggers for her. She has an upcoming trip to [place] planned. The palpitations did not limit her activities throughout the day, just something she noticed when she was still. By the evening, she did not have any palpitations.  She continues to take tikosyn BID, no missed doses.  Continues to take xarelto daily, no bleeding concerns.     she denies chest pain, palpitations, dyspnea, PND, orthopnea, nausea, vomiting, dizziness, syncope, edema, weight gain, or early satiety.       AAD History: Tikosyn     ROS:  Please see the history of present illness. All other systems are reviewed and otherwise negative.    Physical Exam    VS:  BP 138/70 (BP Location: Left Arm, Patient Position: Sitting, Cuff Size: Normal)   Pulse (!) 54   Ht 5\' 4"  (1.626 m)   Wt 156 lb 8 oz (71 kg)   LMP  (LMP Unknown)   SpO2 99%   BMI 26.86 kg/m  BMI: Body mass index is 26.86 kg/m.  Wt Readings from Last 3 Encounters:  11/20/23 156 lb 8 oz (71 kg)  07/30/23 154 lb 4.8 oz (70 kg)  07/21/23 153 lb 11.2 oz (69.7 kg)     GEN- The  patient is well appearing, alert and oriented x 3 today.   Lungs- Clear to ausculation bilaterally, normal work of breathing.  Heart- Regular rate and rhythm, no murmurs, rubs or gallops Extremities- No peripheral edema, warm, dry    Studies Reviewed   Previous EP, cardiology notes.    EKG is ordered. Personal review of EKG from today shows:    EKG Interpretation Date/Time:  Friday November 20 2023 10:07:50 EST Ventricular Rate:  54 PR Interval:  210 QRS Duration:  74 QT Interval:  440 QTC Calculation: 417 R Axis:   -7  Text Interpretation: Sinus bradycardia with 1st degree A-V block Confirmed by Sherie Don [phone]) on 11/20/2023 11:06:07 AM    07/15/23 EKG - SB with 1st deg aV block, rate 53 PR 210 QT 474 QTC 444  TTE, 10/20/2022  1. Left ventricular ejection fraction, by estimation, is 40 to 45%. Left ventricular ejection fraction by 2D MOD biplane is 41.2 %. The left ventricle has mildly decreased function. The left ventricle demonstrates global hypokinesis. Left ventricular diastolic function could not be evaluated.   2. Right ventricular systolic function is normal. The right ventricular size is normal. There is normal pulmonary artery systolic pressure. The estimated right ventricular systolic pressure is 29.4 mmHg.   3. Left atrial size was massively dilated.   4. Right  atrial size was massively dilated.   5. The mitral valve is abnormal. Mild mitral valve regurgitation.   6. The aortic valve is tricuspid. Aortic valve regurgitation is not visualized.   7. The inferior vena cava is normal in size with greater than 50% respiratory variability, suggesting right atrial pressure of 3 mmHg.   Comparison(s): No prior Echocardiogram.      Assessment and Plan    #) persis afib #) high risk medication use - tikosyn #) bradycardia EKG with stable QT/QTC of 440/417 Continue tikosyn BID Patient asymptomatic of bradycardia without activity limitations Patient had  one episode of palpitations iso sleep deprivation and traveling, recommended to reduce these potential triggers as much as possible   #) Hypercoag d/t persis afib CHA2DS2-VASc Score = 2 [CHF History: 0, HTN History: 0, Diabetes History: 0, Stroke History: 0, Vascular Disease History: 0, Age Score: 1, Gender Score: 1].  Therefore, the patient's annual risk of stroke is 2.2 %.    Stroke ppx - 20mg  xarelto, appropriately dosed for CrCl > 49ml/min No bleeding concerns          Current medicines are reviewed at length with the patient today.   The patient does not have concerns regarding her medicines.  The following changes were made today:  none  Labs/ tests ordered today include:  Orders Placed This Encounter  Procedures   EKG 12-Lead     Disposition: Follow up with Dr. Lalla Brothers or EP APP in 6 months   Signed, Sherie Don, NP  11/20/23  11:06 AM  Electrophysiology [facility]

## 2023-11-20 ENCOUNTER — Encounter: Payer: Self-pay | Admitting: Cardiology

## 2023-11-20 ENCOUNTER — Ambulatory Visit: Payer: BC Managed Care – PPO | Attending: Cardiology | Admitting: Cardiology

## 2023-11-20 VITALS — BP 138/70 | HR 54 | Ht 64.0 in | Wt 156.5 lb

## 2023-11-20 DIAGNOSIS — I4819 Other persistent atrial fibrillation: Secondary | ICD-10-CM | POA: Diagnosis not present

## 2023-11-20 DIAGNOSIS — Z79899 Other long term (current) drug therapy: Secondary | ICD-10-CM | POA: Diagnosis not present

## 2023-11-20 DIAGNOSIS — I10 Essential (primary) hypertension: Secondary | ICD-10-CM

## 2023-11-20 DIAGNOSIS — Z5181 Encounter for therapeutic drug level monitoring: Secondary | ICD-10-CM | POA: Diagnosis not present

## 2023-11-20 NOTE — Patient Instructions (Signed)
Medication Instructions:  Your physician recommends that you continue on your current medications as directed. Please refer to the Current Medication list given to you today.   *If you need a refill on your cardiac medications before your next appointment, please call your pharmacy*   Lab Work: No labs ordered today    Testing/Procedures: No test ordered today    Follow-Up: At [facility], you and your health needs are our priority.  As part of our continuing mission to provide you with exceptional heart care, we have created designated Provider Care Teams.  These Care Teams include your primary Cardiologist (physician) and Advanced Practice Providers (APPs -  Physician Assistants and Nurse Practitioners) who all work together to provide you with the care you need, when you need it.  We recommend signing up for the patient portal called "MyChart".  Sign up information is provided on this After Visit Summary.  MyChart is used to connect with patients for Virtual Visits (Telemedicine).  Patients are able to view lab/test results, encounter notes, upcoming appointments, etc.  Non-urgent messages can be sent to your provider as well.   To learn more about what you can do with MyChart, go to [email or web address].    Your next appointment:   6 month(s)  Provider:   Steffanie Dunn, MD or Charlsie Quest, NP

## 2023-12-28 ENCOUNTER — Ambulatory Visit: Payer: Medicare Other | Admitting: Nurse Practitioner

## 2024-01-07 ENCOUNTER — Ambulatory Visit (INDEPENDENT_AMBULATORY_CARE_PROVIDER_SITE_OTHER): Payer: 59 | Admitting: Endocrinology

## 2024-01-07 ENCOUNTER — Encounter: Payer: Self-pay | Admitting: Endocrinology

## 2024-01-07 VITALS — BP 130/80 | HR 56 | Resp 20 | Ht 64.0 in | Wt 158.8 lb

## 2024-01-07 DIAGNOSIS — M810 Age-related osteoporosis without current pathological fracture: Secondary | ICD-10-CM

## 2024-01-07 MED ORDER — ALENDRONATE SODIUM 70 MG PO TABS: 70.0000 mg | ORAL_TABLET | ORAL | 4 refills | Status: DC

## 2024-01-07 NOTE — Patient Instructions (Signed)
Fosamax 70 mg weekly.  Same dose of calcium and Vit D.    Alendronate Solution What is this medication? ALENDRONATE (a LEN droe nate) treats osteoporosis. It works by Interior and spatial designer stronger and less likely to break (fracture). It belongs to a group of medications called bisphosphonates. This medicine may be used for other purposes; ask your health care provider or pharmacist if you have questions. COMMON BRAND NAME(S): Fosamax What should I tell my care team before I take this medication? They need to know if you have any of these conditions: Bleeding disorder Cancer Dental disease Difficulty swallowing Infection (fever, chills, cough, sore throat, pain or trouble passing urine) Kidney disease Low levels of calcium or other minerals in the blood Low red blood cell counts Receiving steroids like dexamethasone or prednisone Stomach or intestine problems Trouble sitting or standing for 30 minutes An unusual or allergic reaction to alendronate, other medications, foods, dyes or preservatives Pregnant or trying to get pregnant Breast-feeding How should I use this medication? Take this medication by mouth with a full glass of water. Take it as directed on the prescription label at the same time every day. Use a specially marked oral syringe, spoon, or dropper to measure each dose. Ask your pharmacist if you do not have one. Household spoons are not accurate. Take the dose right after waking up. Do not eat or drink anything before taking it. Do not take it with any other drink except water. After taking it, do not eat breakfast, drink, or take any other medications or vitamins for at least 30 minutes. Sit or stand up for at least 30 minutes after you take it. Do not lie down. Keep taking it unless your care team tells you to stop. A special MedGuide will be given to you by the pharmacist with each prescription and refill. Be sure to read this information carefully each time. Talk to your  care team about the use of this medication in children. Special care may be needed. Overdosage: If you think you have taken too much of this medicine contact a poison control center or emergency room at once. NOTE: This medicine is only for you. Do not share this medicine with others. What if I miss a dose? If you take your medication once a day, skip it. Take your next dose at the scheduled time the next morning. Do not take two doses on the same day. If you take your medication once a week, take the missed dose on the morning after you remember. Do not take two doses on the same day. What may interact with this medication? Aluminum hydroxide Antacids Aspirin Calcium supplements Iron supplements Magnesium supplements Medications for inflammation like ibuprofen, naproxen, and others Vitamins with minerals This list may not describe all possible interactions. Give your health care provider a list of all the medicines, herbs, non-prescription drugs, or dietary supplements you use. Also tell them if you smoke, drink alcohol, or use illegal drugs. Some items may interact with your medicine. What should I watch for while using this medication? Visit your care team for regular checks on your progress. It may be some time before you see the benefit from this medication. Some people who take this medication have severe bone, joint, or muscle pain. This medication may also increase your risk for jaw problems or a broken thigh bone. Tell your care team right away if you have severe pain in your jaw, bones, joints, or muscles. Tell you care team if you  have any pain that does not go away or that gets worse. Tell your dentist and dental surgeon that you are taking this medication. You should not have major dental surgery while on this medication. See your dentist to have a dental exam and fix any dental problems before starting this medication. Take good care of your teeth while on this medication. Make sure  you see your dentist for regular follow-up appointments. You should make sure you get enough calcium and vitamin D while you are taking this medication. Discuss the foods you eat and the vitamins you take with your care team. You may need blood work done while you are taking this medication. What side effects may I notice from receiving this medication? Side effects that you should report to your care team as soon as possible: Allergic reactions--skin rash, itching, hives, swelling of the face, lips, tongue, or throat Low calcium level--muscle pain or cramps, confusion, tingling, or numbness in the hands or feet Osteonecrosis of the jaw--pain, swelling, or redness in the mouth, numbness of the jaw, poor healing after dental work, unusual discharge from the mouth, visible bones in the mouth Pain or trouble swallowing Severe bone, joint, or muscle pain Stomach bleeding--bloody or black, tar-like stools, vomiting blood or brown material that looks like coffee grounds Side effects that usually do not require medical attention (report to your care team if they continue or are bothersome): Constipation Diarrhea Nausea Stomach pain This list may not describe all possible side effects. Call your doctor for medical advice about side effects. You may report side effects to FDA at 1-800-FDA-1088. Where should I keep my medication? Keep out of the reach of children and pets. Store at room temperature between 20 and 25 degrees C (68 and 77 degrees F). Do not freeze. Throw away any unused medication after the expiration date. NOTE: This sheet is a summary. It may not cover all possible information. If you have questions about this medicine, talk to your doctor, pharmacist, or health care provider.  2024 Elsevier/Gold Standard (2020-11-29 00:00:00)  Zoledronic Acid Injection (Bone Disorders) What is this medication? ZOLEDRONIC ACID (ZOE le dron ik AS id) prevents and treats osteoporosis. It may also be  used to treat Paget's disease of the bone. It works by Interior and spatial designer stronger and less likely to break (fracture). It belongs to a group of medications called bisphosphonates. This medicine may be used for other purposes; ask your health care provider or pharmacist if you have questions. COMMON BRAND NAME(S): Reclast What should I tell my care team before I take this medication? They need to know if you have any of these conditions: Bleeding disorder Cancer Dental disease Kidney disease Low levels of calcium in the blood Low red blood cell counts Lung or breathing disease, such as asthma Receiving steroids, such as dexamethasone or prednisone An unusual or allergic reaction to zoledronic acid, other medications, foods, dyes, or preservatives Pregnant or trying to get pregnant Breast-feeding How should I use this medication? This medication is injected into a vein. It is given by your care team in a hospital or clinic setting. A special MedGuide will be given to you before each treatment. Be sure to read this information carefully each time. Talk to your care team about the use of this medication in children. Special care may be needed. Overdosage: If you think you have taken too much of this medicine contact a poison control center or emergency room at once. NOTE: This medicine is only  for you. Do not share this medicine with others. What if I miss a dose? Keep appointments for follow-up doses. It is important not to miss your dose. Call your care team if you are unable to keep an appointment. What may interact with this medication? Certain antibiotics given by injection Medications for pain and inflammation, such as ibuprofen, naproxen, NSAIDs Some diuretics, such as bumetanide, furosemide Teriparatide This list may not describe all possible interactions. Give your health care provider a list of all the medicines, herbs, non-prescription drugs, or dietary supplements you use. Also  tell them if you smoke, drink alcohol, or use illegal drugs. Some items may interact with your medicine. What should I watch for while using this medication? Visit your care team for regular checks on your progress. It may be some time before you see the benefit from this medication. Some people who take this medication have severe bone, joint, or muscle pain. This medication may also increase your risk for jaw problems or a broken thigh bone. Tell your care team right away if you have severe pain in your jaw, bones, joints, or muscles. Tell your care team if you have any pain that does not go away or that gets worse. You should make sure you get enough calcium and vitamin D while you are taking this medication. Discuss the foods you eat and the vitamins you take with your care team. You may need bloodwork while taking this medication. Tell your dentist and dental surgeon that you are taking this medication. You should not have major dental surgery while on this medication. See your dentist to have a dental exam and fix any dental problems before starting this medication. Take good care of your teeth while on this medication. Make sure you see your dentist for regular follow-up appointments. What side effects may I notice from receiving this medication? Side effects that you should report to your care team as soon as possible: Allergic reactions--skin rash, itching, hives, swelling of the face, lips, tongue, or throat Kidney injury--decrease in the amount of urine, swelling of the ankles, hands, or feet Low calcium level--muscle pain or cramps, confusion, tingling, or numbness in the hands or feet Osteonecrosis of the jaw--pain, swelling, or redness in the mouth, numbness of the jaw, poor healing after dental work, unusual discharge from the mouth, visible bones in the mouth Severe bone, joint, or muscle pain Side effects that usually do not require medical attention (report to your care team if they  continue or are bothersome): Diarrhea Dizziness Headache Nausea Stomach pain Vomiting This list may not describe all possible side effects. Call your doctor for medical advice about side effects. You may report side effects to FDA at 1-800-FDA-1088. Where should I keep my medication? This medication is given in a hospital or clinic. It will not be stored at home. NOTE: This sheet is a summary. It may not cover all possible information. If you have questions about this medicine, talk to your doctor, pharmacist, or health care provider.  2024 Elsevier/Gold Standard (2022-01-17 00:00:00)

## 2024-01-07 NOTE — Progress Notes (Signed)
Outpatient Endocrinology Note [place] Loneta Tamplin, MD   Patient's Name: Kathy Holt    DOB: 13-Feb-1951    MRN: [id number]  REASON OF VISIT: New consult for osteoporosis  REFERRING PROVIDER: Berniece Salines, FNP  PCP: Berniece Salines, FNP  HISTORY OF PRESENT ILLNESS:   Kathy Holt is a 73 y.o. old female with past medical history listed below, is here for new consult of bone health issues / osteoporosis.  Pertinent Bone Health History: Patient was diagnosed with osteoporosis in October 2021, was started on alendronate, took for few months, had extreme weakness with almost passing out and stopped taking it.  Patient had DEXA scan in February 2024 consistent with osteoporosis with lowest T-score of -2.6 lumbar spine.  Patient is referred to endocrinology for further evaluation and management of osteoporosis.   Bone Health Concerns:  Patient had taken Fosamax/alendronate for few months in 2021 stopped due to feeling of weakness.  She is non-smoker.  No excess alcohol intake.  Denies taking systemic glucocorticoid.  Not on antiseizure medication.  No thyroid disorder.  No liver disorder or malabsorption syndrome.  No history of kidney stone.  No family history of kidney stone.  No fragility fracture.  She has been exercising regularly also practices yoga.  No family history of osteoporosis or hip fracture in the parents.  Menopause: At the age of hysterectomy at age of 53, with no ovaries removed.   Fracture history: none  Osteoporosis medications: Fosamax in 2021 for few months.   Secondary workup for osteoporosis: Normal parathyroid hormone.  Normal serum calcium.  Normal vitamin D level.  Normal thyroid function test.  Calcium intake from supplements: 500 mg two times a day ( total 3 tabs / day)  Dietary calcium intake: She has lactose intolerance and not taking much dairy products. Current vitamin D intake: 5000 international units daily.  Normal vitamin D level in July  2024.  Relevant comorbidities: No history of bone cancer. No history of external radiation treatment.   No history of diabetes mellitus.  No Rheumatoid arthritis. No history of GERD.  No history of recent invasive dental procedures. Not planning dental procedures in the near future. Patient has history of osteoarthritis of knees and spine.  She has chronic A-fib and currently on anticoagulation.   Imagings:  DEXA scan in October 2021 : T-score of -3.4 at the spine L1-L4, left femoral neck -2.4 and left total hip -2.2 : No images available.  EXAM: DEXA scan on 529, 2024 : Consistent with osteoporosis. DUAL X-RAY ABSORPTIOMETRY (DXA) FOR BONE MINERAL DENSITY   IMPRESSION: Your patient Kathy Holt completed a BMD test on 02/12/2023 using the [employer] iDXA DXA System (software version: 14.10) manufactured by [employer]. The following summarizes the results of our evaluation. Technologist:VLM PATIENT BIOGRAPHICAL: Name: Kathy Holt, Kathy Holt Patient ID: [id number] Birth Date: 07/17/1951 Height: 64.0 in. Gender: Female Exam Date: 02/12/2023 Weight: 154.0 lbs. Indications: Caucasian, Hysterectomy Fractures: Treatments: calcium w/ vit D DENSITOMETRY RESULTS: Site      Region    Measured Date Measured Age WHO Classification Young Adult T-score BMD         %Change vs. Previous Significant Change (*) AP Spine L1-L4 02/12/2023 70.3 Osteoporosis -2.6 0.868 g/cm2 - -   DualFemur Neck Left 02/12/2023 70.3 Osteopenia -2.4 0.707 g/cm2 - -  ASSESSMENT: The BMD measured at AP Spine L1-L4 is 0.868 g/cm2 with a T-score of -2.6. This patient is considered osteoporotic according to World Health Organization [facility]) criteria.  The scan quality is good.    Interval history   Patient presented to establish endocrinology clinic for osteoporosis.  REVIEW OF SYSTEMS:  As per history of present illness.   PAST MEDICAL HISTORY: Past Medical History:  Diagnosis Date   Arthritis     lower back   DDD (degenerative disc disease), cervical    Dysrhythmia    Afib    PAST SURGICAL HISTORY: Past Surgical History:  Procedure Laterality Date   ABDOMINAL HYSTERECTOMY     partial   ATRIAL FIBRILLATION ABLATION N/A 03/02/2023   Procedure: ATRIAL FIBRILLATION ABLATION;  Surgeon: Lanier Prude, MD;  Location: [facility];  Service: Cardiovascular;  Laterality: N/A;   BLADDER SUSPENSION  1998   CARDIOVERSION N/A 10/30/2022   Procedure: CARDIOVERSION;  Surgeon: Antonieta Iba, MD;  Location: [facility] ORS;  Service: Cardiovascular;  Laterality: N/A;   COLONOSCOPY     KNEE ARTHROSCOPY WITH MEDIAL MENISECTOMY Left 01/30/2021   Procedure: KNEE ARTHROSCOPY WITH PARTIAL MEDIAL/LATERAL MENISECTOMY, PARTIAL SYNOVECTOMY;  Surgeon: Lyndle Herrlich, MD;  Location: [facility] ORS;  Service: Orthopedics;  Laterality: Left;    ALLERGIES: Allergies  Allergen Reactions   Latex Rash    Contact dermatitis    FAMILY HISTORY:  Family History  Problem Relation Age of Onset   Cancer - Lung Mother    Pancreatic cancer Father    Alcoholism Brother     SOCIAL HISTORY: Social History   Socioeconomic History   Marital status: Married    Spouse name: andrew   Number of children: Not on file   Years of education: Not on file   Highest education level: Doctorate  Occupational History   Occupation: run Pensions consultant. at [employer]    Comment: full time  Tobacco Use   Smoking status: Never   Smokeless tobacco: Never   Tobacco comments:    Never smoke 01/20/23  Vaping Use   Vaping status: Never Used  Substance and Sexual Activity   Alcohol use: Never    Comment: not in 20 years   Drug use: Never   Sexual activity: Not Currently  Other Topics Concern   Not on file  Social History Narrative   Patient lives with husband and feels safe in her home.  She still works full time.   Social Drivers of Corporate investment banker Strain: Low Risk  (07/11/2023)   Overall  Financial Resource Strain (CARDIA)    Difficulty of Paying Living Expenses: Not hard at all  Food Insecurity: No Food Insecurity (07/11/2023)   Hunger Vital Sign    Worried About Running Out of Food in the Last Year: Never true    Ran Out of Food in the Last Year: Never true  Transportation Needs: No Transportation Needs (07/11/2023)   PRAPARE - Administrator, Civil Service (Medical): No    Lack of Transportation (Non-Medical): No  Physical Activity: Sufficiently Active (07/11/2023)   Exercise Vital Sign    Days of Exercise per Week: 5 days    Minutes of Exercise per Session: 30 min  Stress: No Stress Concern Present (07/11/2023)   [employer] of Occupational Health - Occupational Stress Questionnaire    Feeling of Stress : Not at all  Social Connections: Unknown (07/11/2023)   Social Connection and Isolation Panel [NHANES]    Frequency of Communication with Friends and Family: Patient declined    Frequency of Social Gatherings with Friends and Family: Patient declined    Attends Religious Services:  Patient declined    Active Member of Clubs or Organizations: Patient declined    Attends Banker Meetings: Not on file    Marital Status: Married    MEDICATIONS:  Current Outpatient Medications  Medication Sig Dispense Refill   alendronate (FOSAMAX) 70 MG tablet Take 1 tablet (70 mg total) by mouth every 7 (seven) days. Take with a full glass of water on an empty stomach. 12 tablet 4   acetaminophen (TYLENOL) 500 MG tablet Take 500 mg by mouth every 8 (eight) hours as needed for moderate pain.     bisoprolol (ZEBETA) 5 MG tablet Take 1 tablet (5 mg total) by mouth daily. (Patient taking differently: Take 2.5 mg by mouth daily.) 90 tablet 3   conjugated estrogens (PREMARIN) vaginal cream Place 1 Applicatorful vaginally 3 (three) times a week. (Patient not taking: Reported on 11/20/2023) 60 g 5   diazepam (VALIUM) 2 MG tablet Take 1 tablet (2 mg total) by mouth  every 12 (twelve) hours as needed for anxiety. 30 tablet 1   dofetilide (TIKOSYN) 500 MCG capsule Take 1 capsule (500 mcg total) by mouth 2 (two) times daily. 60 capsule 2   estradiol (ESTRACE) 0.1 MG/GM vaginal cream Place 1 Applicatorful vaginally at bedtime. 42.5 g 12   famciclovir (FAMVIR) 500 MG tablet Take 1 tablet (500 mg total) by mouth daily. 30 tablet 1   fluticasone (FLONASE) 50 MCG/ACT nasal spray Place 1 spray into both nostrils daily as needed for allergies or rhinitis.     hydrocortisone cream 1 % Apply 1 Application topically 2 (two) times daily. Do not apply to face 15 g 1   MAGNESIUM GLYCINATE PO Take 400 mg by mouth at bedtime.     OVER THE COUNTER MEDICATION Take 1 tablet by mouth daily. Calcium Algae     potassium chloride SA (KLOR-CON M20) 20 MEQ tablet Take 1 tablet (20 mEq total) by mouth daily. 30 tablet 2   XARELTO 20 MG TABS tablet TAKE 1 TABLET BY MOUTH DAILY WITH SUPPER 90 tablet 0   No current facility-administered medications for this visit.    PHYSICAL EXAM: Vitals:   01/07/24 1355 01/07/24 1356  BP: (!) 142/90 130/80  Pulse: (!) 56   Resp: 20   SpO2: 99%   Weight: 158 lb 12.8 oz (72 kg)   Height: 5\' 4"  (1.626 m)    Body mass index is 27.26 kg/m.  Wt Readings from Last 3 Encounters:  01/07/24 158 lb 12.8 oz (72 kg)  11/20/23 156 lb 8 oz (71 kg)  07/30/23 154 lb 4.8 oz (70 kg)    General: Well developed, well nourished female in no apparent distress.  HEENT: AT/[place], no external lesions. Hearing intact to the spoken word Eyes:  Conjunctiva clear and no icterus. Neck: Trachea midline, neck supple  Lungs: Clear to auscultation, no wheeze. Respirations not labored Heart: S1S2, Regular in rate and rhythm.  Abdomen: Soft, non tender Neurologic: Alert, oriented, normal speech, deep tendon biceps reflexes normal,  no gross focal neurological deficit Extremities: No pedal pitting edema, no tremors of outstretched hands. No spine tenderness Skin: Warm,  color good.  Psychiatric: Does not appear depressed or anxious  PERTINENT HISTORIC LABORATORY AND IMAGING STUDIES:  All pertinent laboratory results were reviewed. Please see HPI also for further details.   No results found for: "ICA", "ALKPHOS"  @RESULTFAST (VitD25OHD2:3,VitD25OHD3:3,VitD25OHTOT:3,VitD25OH:3)@ @RESUFASTREFRESH (TSH,T3Total,T4Tot,T4Free,ThyrPeroxAb)@     Last bone density done within the [facility] system: Last Dexa Date: @LPPLINKREFRESHABLE ([id number])@    ASSESSMENT /  PLAN  1. Age-related osteoporosis without current pathological fracture     Kathy Holt is a 73 y.o. old female with osteoporosis based on DXA scan results. Patient has risk factor for osteoporosis, including age, postmenopausal.  Patient had taken Fosamax in the past in 2021 for few months however stopped due to feeling of extreme weakness.  She denied GI related symptoms at that time.  Discussed that she needs pharmacological treatment for osteoporosis.  DEXA scan in 2021 with T-score of lumbar spine -3.4 however no images available, however DEXA scan in February 2024 with T-score at lumbar spine -2.6 consistent with osteoporosis.  Patient has osteoarthritis of the spine and knees possibly bone density may have been falsely elevated however does not seem to be too much affected by osteoarthritis on the images of most recent DEXA scan.  Patient is willing to try Fosamax.  Discussed about option of injectable antiresorptive therapy including Reclast and Prolia.  I think Kathy Holt would be a good candidate for treatment with alendronate (Fosamax). I explained to the potential complications of treatment with bisphosphonates, including the rare but serious osteonecrosis of the jaw and atypical bone fractures. We also talked about the gastrointestinal side effects of alendronate and the importance of following an appropriate administration technique.   Plan: -Start alendronate 70 mg p.o. 1 tablet weekly. -If  patient develops any side effects asked to call our clinic, will change to injectable antiresorptive therapy. -Continue to take in at least 1200 calcium daily, between diet and supplements and current vitamin D supplement, taking vitamin D3 5000 international unit daily. - Regular weight bearing exercises recommended as tolerated by the patient.  Discussed about fall precautions.   Diagnoses and all orders for this visit:  Age-related osteoporosis without current pathological fracture -     alendronate (FOSAMAX) 70 MG tablet; Take 1 tablet (70 mg total) by mouth every 7 (seven) days. Take with a full glass of water on an empty stomach.    DISPOSITION Follow up in clinic in 12 months suggested.  All questions answered and patient verbalized understanding of the plan.  [place] Cherese Lozano, MD [facility] Endocrinology [facility] [address] [place], [address] [place], [place] [address] Phone # [phone]  At least part of this note was generated using voice recognition software. Inadvertent word errors may have occurred, which were not recognized during the proofreading process.

## 2024-01-11 ENCOUNTER — Ambulatory Visit (INDEPENDENT_AMBULATORY_CARE_PROVIDER_SITE_OTHER): Payer: 59

## 2024-01-11 VITALS — Ht 64.0 in | Wt 155.0 lb

## 2024-01-11 DIAGNOSIS — Z1231 Encounter for screening mammogram for malignant neoplasm of breast: Secondary | ICD-10-CM

## 2024-01-11 DIAGNOSIS — Z0000 Encounter for general adult medical examination without abnormal findings: Secondary | ICD-10-CM | POA: Diagnosis not present

## 2024-01-11 NOTE — Progress Notes (Signed)
Because this visit was a virtual/telehealth visit,  certain criteria was not obtained, such a blood pressure, CBG if applicable, and timed get up and go. Any medications not marked as "taking" were not mentioned during the medication reconciliation part of the visit. Any vitals not documented were not able to be obtained due to this being a telehealth visit or patient was unable to self-report a recent blood pressure reading due to a lack of equipment at home via telehealth. Vitals that have been documented are verbally provided by the patient.   Subjective:   Kathy Holt is a 73 y.o. female who presents for Medicare Annual (Subsequent) preventive examination.  Visit Complete: Virtual I connected with  Kathy Holt on 01/11/24 by a video and audio enabled telemedicine application and verified that I am speaking with the correct person using two identifiers.  Patient Location: Home  Provider Location: Home Office  I discussed the limitations of evaluation and management by telemedicine. The patient expressed understanding and agreed to proceed.  Vital Signs: Because this visit was a virtual/telehealth visit, some criteria may be missing or patient reported. Any vitals not documented were not able to be obtained and vitals that have been documented are patient reported.  Patient Medicare AWV questionnaire was completed by the patient on 01/10/2024; I have confirmed that all information answered by patient is correct and no changes since this date.  Cardiac Risk Factors include: advanced age (>24men, >77 women);Other (see comment), Risk factor comments: hx of A Fib     Objective:    Today's Vitals   01/11/24 0847  Weight: 155 lb (70.3 kg)  Height: 5\' 4"  (1.626 m)   Body mass index is 26.61 kg/m.     01/11/2024    8:45 AM 03/02/2023    6:09 AM 01/20/2023    2:54 PM 01/24/2021    1:07 PM  Advanced Directives  Does Patient Have a Medical Advance Directive? No Yes Yes Yes   Type of Special educational needs teacher of [place];Living will Living will Living will;Healthcare Power of Attorney  Does patient want to make changes to medical advance directive?   No - Patient declined No - Patient declined  Copy of Healthcare Power of Attorney in Chart?    No - copy requested  Would patient like information on creating a medical advance directive? Yes (MAU/Ambulatory/Procedural Areas - Information given)       Current Medications (verified) Outpatient Encounter Medications as of 01/11/2024  Medication Sig   acetaminophen (TYLENOL) 500 MG tablet Take 500 mg by mouth every 8 (eight) hours as needed for moderate pain.   alendronate (FOSAMAX) 70 MG tablet Take 1 tablet (70 mg total) by mouth every 7 (seven) days. Take with a full glass of water on an empty stomach.   bisoprolol (ZEBETA) 5 MG tablet Take 1 tablet (5 mg total) by mouth daily. (Patient taking differently: Take 2.5 mg by mouth daily.)   dofetilide (TIKOSYN) 500 MCG capsule Take 1 capsule (500 mcg total) by mouth 2 (two) times daily.   estradiol (ESTRACE) 0.1 MG/GM vaginal cream Place 1 Applicatorful vaginally at bedtime.   famciclovir (FAMVIR) 500 MG tablet Take 1 tablet (500 mg total) by mouth daily.   fluticasone (FLONASE) 50 MCG/ACT nasal spray Place 1 spray into both nostrils daily as needed for allergies or rhinitis.   MAGNESIUM GLYCINATE PO Take 400 mg by mouth at bedtime.   OVER THE COUNTER MEDICATION Take 1 tablet by mouth daily. Calcium Algae  potassium chloride SA (KLOR-CON M20) 20 MEQ tablet Take 1 tablet (20 mEq total) by mouth daily.   XARELTO 20 MG TABS tablet TAKE 1 TABLET BY MOUTH DAILY WITH SUPPER   conjugated estrogens (PREMARIN) vaginal cream Place 1 Applicatorful vaginally 3 (three) times a week. (Patient not taking: Reported on 11/20/2023)   diazepam (VALIUM) 2 MG tablet Take 1 tablet (2 mg total) by mouth every 12 (twelve) hours as needed for anxiety.   hydrocortisone cream 1 % Apply 1  Application topically 2 (two) times daily. Do not apply to face   No facility-administered encounter medications on file as of 01/11/2024.    Allergies (verified) Latex   History: Past Medical History:  Diagnosis Date   Arthritis    lower back   DDD (degenerative disc disease), cervical    Dysrhythmia    Afib   Past Surgical History:  Procedure Laterality Date   ABDOMINAL HYSTERECTOMY     partial   ATRIAL FIBRILLATION ABLATION N/A 03/02/2023   Procedure: ATRIAL FIBRILLATION ABLATION;  Surgeon: Lanier Prude, MD;  Location: [facility];  Service: Cardiovascular;  Laterality: N/A;   BLADDER SUSPENSION  1998   CARDIOVERSION N/A 10/30/2022   Procedure: CARDIOVERSION;  Surgeon: Antonieta Iba, MD;  Location: [facility];  Service: Cardiovascular;  Laterality: N/A;   COLONOSCOPY     KNEE ARTHROSCOPY WITH MEDIAL MENISECTOMY Left 01/30/2021   Procedure: KNEE ARTHROSCOPY WITH PARTIAL MEDIAL/LATERAL MENISECTOMY, PARTIAL SYNOVECTOMY;  Surgeon: Lyndle Herrlich, MD;  Location: [facility] OR[facility];  Service: Orthopedics;  Laterality: Left;   Family History  Problem Relation Age of Onset   Cancer - Lung Mother    Pancreatic cancer Father    Alcoholism Brother    Social History   Socioeconomic History   Marital status: Married    Spouse name: andrew   Number of children: Not on file   Years of education: Not on file   Highest education level: Doctorate  Occupational History   Occupation: run Pensions consultant. at [employer]    Comment: full time  Tobacco Use   Smoking status: Never   Smokeless tobacco: Never   Tobacco comments:    Never smoke 01/20/23  Vaping Use   Vaping status: Never Used  Substance and Sexual Activity   Alcohol use: Never    Comment: not in 20 years   Drug use: Never   Sexual activity: Not Currently  Other Topics Concern   Not on file  Social History Narrative   Patient lives with husband and feels safe in her home.  She still works full time.       Updated 1/27, 2025: Patient is preparing for retirement in May 2025 and moving to [place]   Social Drivers of Health   Financial Resource Strain: Low Risk  (01/11/2024)   Overall Financial Resource Strain (CARDIA)    Difficulty of Paying Living Expenses: Not hard at all  Food Insecurity: No Food Insecurity (01/11/2024)   Hunger Vital Sign    Worried About Running Out of Food in the Last Year: Never true    Ran Out of Food in the Last Year: Never true  Transportation Needs: No Transportation Needs (01/11/2024)   PRAPARE - Administrator, Civil Service (Medical): No    Lack of Transportation (Non-Medical): No  Physical Activity: Sufficiently Active (01/11/2024)   Exercise Vital Sign    Days of Exercise per Week: 7 days    Minutes of Exercise per Session: 30  min  Stress: No Stress Concern Present (01/11/2024)   [employer] of Occupational Health - Occupational Stress Questionnaire    Feeling of Stress : Not at all  Social Connections: Patient Declined (01/11/2024)   Social Connection and Isolation Panel [NHANES]    Frequency of Communication with Friends and Family: Patient declined    Frequency of Social Gatherings with Friends and Family: Patient declined    Attends Religious Services: Patient declined    Database administrator or Organizations: Patient declined    Attends Banker Meetings: Patient declined    Marital Status: Patient declined    Tobacco Counseling Counseling given: Yes Tobacco comments: Never smoke 01/20/23   Clinical Intake:  Pre-visit preparation completed: Yes  Pain : No/denies pain     BMI - recorded: 26.61 Nutritional Risks: None Diabetes: No  How often do you need to have someone help you when you read instructions, pamphlets, or other written materials from your doctor or pharmacy?: 1 - Never  Interpreter Needed?: No  Information entered by :: Maryjean Ka CMA   Activities of Daily Living    01/10/2024    9:32 AM  07/30/2023   10:42 AM  In your present state of health, do you have any difficulty performing the following activities:  Hearing? 0 0  Vision? 0 0  Difficulty concentrating or making decisions? 0 0  Walking or climbing stairs? 0 0  Dressing or bathing? 0 0  Doing errands, shopping? 0 0  Preparing Food and eating ? N   Using the Toilet? N   In the past six months, have you accidently leaked urine? N   Do you have problems with loss of bowel control? N   Managing your Medications? N   Managing your Finances? N   Housekeeping or managing your Housekeeping? N     Patient Care Team: Berniece Salines, FNP as PCP - General (Nurse Practitioner) Lanier Prude, MD as PCP - Electrophysiology (Cardiology)  Indicate any recent Medical Services you may have received from other than [facility] providers in the past year (date may be approximate).     Assessment:   This is a routine wellness examination for Kathy Holt.  Hearing/Vision screen Hearing Screening - Comments:: Patient denies any hearing difficulties.   Vision Screening - Comments:: Wears rx glasses - up to date with routine eye exams  Sees Dr. Lynnae Prude @ [facility] in [place]   Goals Addressed             This Visit's Progress    Patient Stated       Prepare for retirement in May and then move back to [facility] [place]       Depression Screen    01/11/2024    8:57 AM 07/30/2023   10:42 AM 07/21/2023   10:56 AM 07/21/2023   10:53 AM 07/13/2023    9:46 AM 01/09/2023    7:58 AM 10/06/2022    2:04 PM  PHQ 2/9 Scores  PHQ - 2 Score 0 0 0 0 0 0 0  PHQ- 9 Score 0  2   2 3     Fall Risk    01/10/2024    9:32 AM 07/30/2023   10:42 AM 07/21/2023   10:53 AM 07/13/2023    9:46 AM 01/09/2023    7:57 AM  Fall Risk   Falls in the past year? 1 0 0 0 0  Number falls in past yr: 0 0 0 0 0  Injury  with Fall? 0 0 0 0 0  Risk for fall due to : No Fall Risks No Fall Risks     Follow up Falls prevention discussed        MEDICARE RISK  AT HOME: Medicare Risk at Home Any stairs in or around the home?: (Patient-Rptd) Yes If so, are there any without handrails?: (Patient-Rptd) No Home free of loose throw rugs in walkways, pet beds, electrical cords, etc?: (Patient-Rptd) Yes Adequate lighting in your home to reduce risk of falls?: (Patient-Rptd) Yes Life alert?: (Patient-Rptd) No Use of a cane, walker or w/c?: (Patient-Rptd) No Grab bars in the bathroom?: (Patient-Rptd) Yes Shower chair or bench in shower?: (Patient-Rptd) Yes Elevated toilet seat or a handicapped toilet?: (Patient-Rptd) No  TIMED UP AND GO:  Was the test performed?  No    Cognitive Function:        01/11/2024    8:54 AM  6CIT Screen  What Year? 0 points  What month? 0 points  What time? 0 points  Count back from 20 0 points  Months in reverse 0 points  Repeat phrase 0 points  Total Score 0 points    Immunizations Immunization History  Administered Date(s) Administered   Influenza Inj Mdck Quad Pf 10/24/2021   Influenza,inj,Quad PF,6+ Mos 10/09/2020   Influenza-Unspecified 08/03/2022   PFIZER Comirnaty(Gray Top)Covid-19 Tri-Sucrose Vaccine 09/11/2020   PFIZER(Purple Top)SARS-COV-2 Vaccination 01/27/2020, 02/21/2020   Pfizer Covid-19 Vaccine Bivalent Booster 75yrs & up 05/21/2022   Pfizer(Comirnaty)Fall Seasonal Vaccine 12 years and older 09/13/2022   Pneumococcal Conjugate-13 11/03/2018, 10/24/2021   Respiratory Syncytial Virus Vaccine,Recomb Aduvanted(Arexvy) 09/05/2022   Zoster Recombinant(Shingrix) 07/09/2021, 09/20/2021    TDAP status: Due, Education has been provided regarding the importance of this vaccine. Advised may receive this vaccine at local pharmacy or Health Dept. Aware to provide a copy of the vaccination record if obtained from local pharmacy or Health Dept. Verbalized acceptance and understanding.  Flu Vaccine status: Up to date  Pneumococcal vaccine status: Up to date  Covid-19 vaccine status: Completed  vaccines  Qualifies for Shingles Vaccine? No   Zostavax completed No   Shingrix Completed?: Yes  Screening Tests Health Maintenance  Topic Date Due   Medicare Annual Wellness (AWV)  Never done   DTaP/Tdap/Td (1 - Tdap) Never done   Pneumonia Vaccine 61+ Years old (2 of 2 - PPSV23 or PCV20) 10/24/2022   COVID-19 Vaccine (6 - 2024-25 season) 08/16/2023   MAMMOGRAM  02/11/2025   Fecal DNA (Cologuard)  10/23/2025   INFLUENZA VACCINE  Completed   DEXA SCAN  Completed   Hepatitis C Screening  Completed   Zoster Vaccines- Shingrix  Completed   HPV VACCINES  Aged Out    Health Maintenance  Health Maintenance Due  Topic Date Due   Medicare Annual Wellness (AWV)  Never done   DTaP/Tdap/Td (1 - Tdap) Never done   Pneumonia Vaccine 46+ Years old (2 of 2 - PPSV23 or PCV20) 10/24/2022   COVID-19 Vaccine (6 - 2024-25 season) 08/16/2023    Colorectal cancer screening: Type of screening: Cologuard. Completed 10/23/2022. Repeat every 3 years  Mammogram status: Ordered 01/11/2024. Pt provided with contact info and advised to call to schedule appt.   Bone Density status: Completed 02/11/2023. Results reflect: Bone density results: OSTEOPOROSIS. Repeat every 2 years.  Lung Cancer Screening: (Low Dose CT Chest recommended if Age 54-80 years, 20 pack-year currently smoking OR have quit w/in 15years.) does not qualify.   Lung Cancer Screening Referral: na  Additional  Screening:  Hepatitis C Screening: does not qualify; Completed   Vision Screening: Recommended annual ophthalmology exams for early detection of glaucoma and other disorders of the eye. Is the patient up to date with their annual eye exam?  Yes  Who is the provider or what is the name of the office in which the patient attends annual eye exams? Dr. Lynnae Prude w/ [facility] [place] If pt is not established with a provider, would they like to be referred to a provider to establish care? No .   Dental Screening: Recommended  annual dental exams for proper oral hygiene  Diabetic Foot Exam: na  Community Resource Referral / Chronic Care Management: CRR required this visit?  No   CCM required this visit?  No     Plan:     I have personally reviewed and noted the following in the patient's chart:   Medical and social history Use of alcohol, tobacco or illicit drugs  Current medications and supplements including opioid prescriptions. Patient is not currently taking opioid prescriptions. Functional ability and status Nutritional status Physical activity Advanced directives List of other physicians Hospitalizations, surgeries, and ER visits in previous 12 months Vitals Screenings to include cognitive, depression, and falls Referrals and appointments  In addition, I have reviewed and discussed with patient certain preventive protocols, quality metrics, and best practice recommendations. A written personalized care plan for preventive services as well as general preventive health recommendations were provided to patient.     Jordan Hawks Kathy Holt, CMA   01/11/2024   After Visit Summary: (MyChart) Due to this being a telephonic visit, the after visit summary with patients personalized plan was offered to patient via MyChart   Nurse Notes: see routing comment

## 2024-01-11 NOTE — Patient Instructions (Signed)
Kathy Holt , Thank you for taking time to come for your Medicare Wellness Visit. I appreciate your ongoing commitment to your health goals. Please review the following plan we discussed and let me know if I can assist you in the future.   Referrals/Orders/Follow-Ups/Clinician Recommendations:  Next Medicare Annual Wellness Visit:January 16, 2025 at 8:45 am virtual visit  CONGRATULATIONS ON YOUR UPCOMING RETIREMENT!!!!   You have an order for:  []   2D Mammogram  []   3D Mammogram  []   Bone Density     Please call for appointment:   [facility] [address]. Geanie Logan [place], [place] [address] [phone]    Make sure to wear two-piece clothing.  No lotions powders or deodorants the day of the appointment Make sure to bring picture ID and insurance card.  Bring list of medications you are currently taking including any supplements.   Schedule your [facility] screening mammogram through MyChart!   Log into your MyChart account.  Go to 'Visit' (or 'Appointments' if on mobile App) --> Schedule an Appointment  Under 'Select a Reason for Visit' choose the Mammogram Screening option.  Complete the pre-visit questions and select the time and place that best fits your schedule.    This is a list of the screening recommended for you and due dates:  Health Maintenance  Topic Date Due   DTaP/Tdap/Td vaccine (1 - Tdap) Never done   COVID-19 Vaccine (7 - 2024-25 season) 11/01/2023   Mammogram  02/12/2024   Medicare Annual Wellness Visit  01/10/2025   DEXA scan (bone density measurement)  02/11/2025   Cologuard (Stool DNA test)  10/23/2025   Pneumonia Vaccine  Completed   Flu Shot  Completed   Hepatitis C Screening  Completed   Zoster (Shingles) Vaccine  Completed   HPV Vaccine  Aged Out    Advanced directives: (ACP Link)Information on Advanced Care Planning can be found at [facility] Advance Health Care Directives Advance  Health Care Directives ([email or web address]/)   Next Medicare Annual Wellness Visit scheduled for next year: yes  Preventive Care 65 Years and Older, Female Preventive care refers to lifestyle choices and visits with your health care provider that can promote health and wellness. Preventive care visits are also called wellness exams. What can I expect for my preventive care visit? Counseling Your health care provider may ask you questions about your: Medical history, including: Past medical problems. Family medical history. Pregnancy and menstrual history. History of falls. Current health, including: Memory and ability to understand (cognition). Emotional well-being. Home life and relationship well-being. Sexual activity and sexual health. Lifestyle, including: Alcohol, nicotine or tobacco, and drug use. Access to firearms. Diet, exercise, and sleep habits. Work and work Astronomer. Sunscreen use. Safety issues such as seatbelt and bike helmet use. Physical exam Your health care provider will check your: Height and weight. These may be used to calculate your BMI (body mass index). BMI is a measurement that tells if you are at a healthy weight. Waist circumference. This measures the distance around your waistline. This measurement also tells if you are at a healthy weight and may help predict your risk of certain diseases, such as type 2 diabetes and high blood pressure. Heart rate and blood pressure. Body temperature. Skin for abnormal spots. What immunizations do I need?  Vaccines are usually given at various ages, according to a schedule. Your health care provider will recommend vaccines for you based on your age, medical history,  and lifestyle or other factors, such as travel or where you work. What tests do I need? Screening Your health care provider may recommend screening tests for certain conditions. This may include: Lipid and cholesterol levels. Hepatitis C test. Hepatitis B  test. HIV (human immunodeficiency virus) test. STI (sexually transmitted infection) testing, if you are at risk. Lung cancer screening. Colorectal cancer screening. Diabetes screening. This is done by checking your blood sugar (glucose) after you have not eaten for a while (fasting). Mammogram. Talk with your health care provider about how often you should have regular mammograms. BRCA-related cancer screening. This may be done if you have a family history of breast, ovarian, tubal, or peritoneal cancers. Bone density scan. This is done to screen for osteoporosis. Talk with your health care provider about your test results, treatment options, and if necessary, the need for more tests. Follow these instructions at home: Eating and drinking  Eat a diet that includes fresh fruits and vegetables, whole grains, lean protein, and low-fat dairy products. Limit your intake of foods with high amounts of sugar, saturated fats, and salt. Take vitamin and mineral supplements as recommended by your health care provider. Do not drink alcohol if your health care provider tells you not to drink. If you drink alcohol: Limit how much you have to 0-1 drink a day. Know how much alcohol is in your drink. In the [place] one drink equals one 12 oz bottle of beer (355 mL), one 5 oz glass of wine (148 mL), or one 1 oz glass of hard liquor (44 mL). Lifestyle Brush your teeth every morning and night with fluoride toothpaste. Floss one time each day. Exercise for at least 30 minutes 5 or more days each week. Do not use any products that contain nicotine or tobacco. These products include cigarettes, chewing tobacco, and vaping devices, such as e-cigarettes. If you need help quitting, ask your health care provider. Do not use drugs. If you are sexually active, practice safe sex. Use a condom or other form of protection in order to prevent STIs. Take aspirin only as told by your health care provider. Make sure that you  understand how much to take and what form to take. Work with your health care provider to find out whether it is safe and beneficial for you to take aspirin daily. Ask your health care provider if you need to take a cholesterol-lowering medicine (statin). Find healthy ways to manage stress, such as: Meditation, yoga, or listening to music. Journaling. Talking to a trusted person. Spending time with friends and family. Minimize exposure to UV radiation to reduce your risk of skin cancer. Safety Always wear your seat belt while driving or riding in a vehicle. Do not drive: If you have been drinking alcohol. Do not ride with someone who has been drinking. When you are tired or distracted. While texting. If you have been using any mind-altering substances or drugs. Wear a helmet and other protective equipment during sports activities. If you have firearms in your house, make sure you follow all gun safety procedures. What's next? Visit your health care provider once a year for an annual wellness visit. Ask your health care provider how often you should have your eyes and teeth checked. Stay up to date on all vaccines. This information is not intended to replace advice given to you by your health care provider. Make sure you discuss any questions you have with your health care provider. Document Revised: 05/29/2021 Document Reviewed: 05/29/2021 Elsevier Patient  Education  2024 [employer].  Understanding Your Risk for Falls Millions of people have serious injuries from falls each year. It is important to understand your risk of falling. Talk with your health care provider about your risk and what you can do to lower it. If you do have a serious fall, make sure to tell your provider. Falling once raises your risk of falling again. How can falls affect me? Serious injuries from falls are common. These include: Broken bones, such as hip fractures. Head injuries, such as traumatic brain  injuries (TBI) or concussions. A fear of falling can cause you to avoid activities and stay at home. This can make your muscles weaker and raise your risk for a fall. What can increase my risk? There are a number of risk factors that increase your risk for falling. The more risk factors you have, the higher your risk of falling. Serious injuries from a fall happen most often to people who are older than 72 years old. Teenagers and young adults ages 44-29 are also at higher risk. Common risk factors include: Weakness in the lower body. Being generally weak or confused due to long-term (chronic) illness. Dizziness or balance problems. Poor vision. Medicines that cause dizziness or drowsiness. These may include: Medicines for your blood pressure, heart, anxiety, insomnia, or swelling (edema). Pain medicines. Muscle relaxants. Other risk factors include: Drinking alcohol. Having had a fall in the past. Having foot pain or wearing improper footwear. Working at a dangerous job. Having any of the following in your home: Tripping hazards, such as floor clutter or loose rugs. Poor lighting. Pets. Having dementia or memory loss. What actions can I take to lower my risk of falling?     Physical activity Stay physically fit. Do strength and balance exercises. Consider taking a regular class to build strength and balance. Yoga and tai chi are good options. Vision Have your eyes checked every year and your prescription for glasses or contacts updated as needed. Shoes and walking aids Wear non-skid shoes. Wear shoes that have rubber soles and low heels. Do not wear high heels. Do not walk around the house in socks or slippers. Use a cane or walker as told by your provider. Home safety Attach secure railings on both sides of your stairs. Install grab bars for your bathtub, shower, and toilet. Use a non-skid mat in your bathtub or shower. Attach bath mats securely with double-sided, non-slip  rug tape. Use good lighting in all rooms. Keep a flashlight near your bed. Make sure there is a clear path from your bed to the bathroom. Use night-lights. Do not use throw rugs. Make sure all carpeting is taped or tacked down securely. Remove all clutter from walkways and stairways, including extension cords. Repair uneven or broken steps and floors. Avoid walking on icy or slippery surfaces. Walk on the grass instead of on icy or slick sidewalks. Use ice melter to get rid of ice on walkways in the winter. Use a cordless phone. Questions to ask your health care provider Can you help me check my risk for a fall? Do any of my medicines make me more likely to fall? Should I take a vitamin D supplement? What exercises can I do to improve my strength and balance? Should I make an appointment to have my vision checked? Do I need a bone density test to check for weak bones (osteoporosis)? Would it help to use a cane or a walker? Where to find more information Centers  for Disease Control and Prevention, STEADI: TonerPromos.no Community-Based Fall Prevention Programs: TonerPromos.no [employer] on Aging: BaseRingTones.pl Contact a health care provider if: You fall at home. You are afraid of falling at home. You feel weak, drowsy, or dizzy. This information is not intended to replace advice given to you by your health care provider. Make sure you discuss any questions you have with your health care provider. Document Revised: 08/04/2022 Document Reviewed: 08/04/2022 Elsevier Patient Education  2024 [employer].

## 2024-01-13 ENCOUNTER — Other Ambulatory Visit: Payer: Self-pay | Admitting: Nurse Practitioner

## 2024-01-13 DIAGNOSIS — I4891 Unspecified atrial fibrillation: Secondary | ICD-10-CM

## 2024-01-15 NOTE — Telephone Encounter (Signed)
Requested Prescriptions  Pending Prescriptions Disp Refills   XARELTO 20 MG TABS tablet [Pharmacy Med Name: XARELTO 20 MG TABLET] 90 tablet 0    Sig: TAKE 1 TABLET BY MOUTH DAILY WITH SUPPER     Hematology: Anticoagulants - rivaroxaban Failed - 01/15/2024  8:07 AM      Failed - ALT in normal range and within 360 days    ALT  Date Value Ref Range Status  10/06/2022 16 6 - 29 U/L Final         Failed - AST in normal range and within 360 days    AST  Date Value Ref Range Status  10/06/2022 17 10 - 35 U/L Final         Passed - Cr in normal range and within 360 days    Creat  Date Value Ref Range Status  10/06/2022 0.73 0.60 - 1.00 mg/dL Final   Creatinine, Ser  Date Value Ref Range Status  07/15/2023 0.71 0.57 - 1.00 mg/dL Final         Passed - HCT in normal range and within 360 days    HCT  Date Value Ref Range Status  02/24/2023 41.9 36.0 - 46.0 % Final         Passed - HGB in normal range and within 360 days    Hemoglobin  Date Value Ref Range Status  02/24/2023 14.2 12.0 - 15.0 g/dL Final         Passed - PLT in normal range and within 360 days    Platelets  Date Value Ref Range Status  02/24/2023 186 150 - 400 K/uL Final         Passed - eGFR is 15 or above and within 360 days    GFR, Estimated  Date Value Ref Range Status  02/24/2023 >60 >60 mL/min Final    Comment:    (NOTE) Calculated using the CKD-EPI Creatinine Equation (2021)    eGFR  Date Value Ref Range Status  07/15/2023 91 >59 mL/min/1.73 Final         Passed - Patient is not pregnant      Passed - Valid encounter within last 12 months    Recent Outpatient Visits           5 months ago Burn   [facility] Berniece Salines, FNP   5 months ago Age-related osteoporosis without current pathological fracture   [facility] Berniece Salines, FNP   6 months ago Acute cystitis with hematuria   [facility] Berniece Salines, FNP   1 year ago Atrial fibrillation, unspecified type [facility])   [facility] Berniece Salines, FNP   1 year ago Atrial fibrillation, unspecified type [facility])   [facility] Berniece Salines, FNP       Future Appointments             In 3 days Zane Herald, Rudolpho Sevin, FNP [facility]

## 2024-01-18 ENCOUNTER — Ambulatory Visit (INDEPENDENT_AMBULATORY_CARE_PROVIDER_SITE_OTHER): Payer: 59 | Admitting: Nurse Practitioner

## 2024-01-18 ENCOUNTER — Encounter: Payer: Self-pay | Admitting: Nurse Practitioner

## 2024-01-18 VITALS — BP 116/74 | HR 67 | Temp 97.8°F | Resp 18 | Ht 64.0 in | Wt 157.8 lb

## 2024-01-18 DIAGNOSIS — Z780 Asymptomatic menopausal state: Secondary | ICD-10-CM

## 2024-01-18 DIAGNOSIS — M810 Age-related osteoporosis without current pathological fracture: Secondary | ICD-10-CM | POA: Diagnosis not present

## 2024-01-18 DIAGNOSIS — I4819 Other persistent atrial fibrillation: Secondary | ICD-10-CM

## 2024-01-18 DIAGNOSIS — M1990 Unspecified osteoarthritis, unspecified site: Secondary | ICD-10-CM

## 2024-01-18 DIAGNOSIS — R635 Abnormal weight gain: Secondary | ICD-10-CM

## 2024-01-18 DIAGNOSIS — Z13220 Encounter for screening for lipoid disorders: Secondary | ICD-10-CM

## 2024-01-18 DIAGNOSIS — Z130 Encounter for screening for diseases of the blood and blood-forming organs and certain disorders involving the immune mechanism: Secondary | ICD-10-CM

## 2024-01-18 DIAGNOSIS — Z131 Encounter for screening for diabetes mellitus: Secondary | ICD-10-CM

## 2024-01-18 MED ORDER — ESTRADIOL 0.5 MG PO TABS: 0.5000 mg | ORAL_TABLET | Freq: Every day | ORAL | 3 refills | Status: DC

## 2024-01-18 NOTE — Assessment & Plan Note (Signed)
We also discussed HRT.  She would like to take HRT, she says that her mother and sister also took HRT without difficulty.  No family history of breast cancer,  she is up to date on mammograms.  Discussed other risks, she reports that she is willing to try and see how she responds.  She does not have a uterus. Start estradiol 0.5 mg daily.

## 2024-01-18 NOTE — Assessment & Plan Note (Addendum)
Pt condition is stable, will continue current medications and exercise regimen and will return for 6 month follow up appointment. We also discussed HRT.  She would like to take HRT, she says that her mother and sister also took HRT without difficulty.  No family history of breast cancer,  she is up to date on mammograms.  Discussed other risks, she reports that she is willing to try and see how she responds.  She does not have a uterus. Start estradiol 0.5 mg daily.

## 2024-01-18 NOTE — Progress Notes (Signed)
BP 116/74   Pulse 67   Temp 97.8 F (36.6 C)   Resp 18   Ht 5\' 4"  (1.626 m)   Wt 157 lb 12.8 oz (71.6 kg)   LMP  (LMP Unknown)   SpO2 96%   BMI 27.09 kg/m    Subjective:    Patient ID: Kathy Holt, female    DOB: 1951-12-05, 73 y.o.   MRN: [id number]  HPI: Kathy Holt is a 73 y.o. female  Chief Complaint  Patient presents with   Medical Management of Chronic Issues  Pt returns to clinic for 6 month follow-up appointment. Pt has a history of A-fib, Arthritis, & Osteoporosis. Currently taking bisoprolol, dofetilide, xarelto, and fosamax, no adverse reactions, tolerating meds well.  A-fib:  Denies any episodes of palpitations, dizziness, chest pain, SOB, and swelling in legs. Managed by cardiology  last seen on 11/20/2023.  No changes in treatment plan.   Arthritis: Reports worsening stiffness and mild discomfort in bilateral knees. Denies difficulty with activity   Osteoporosis: Denies any falls or new back pain. Does yoga and walks at least 3 days a week for exercise. Currently on fosamax and managed by endocrinology.  Previously was on fosamax but  did not tolerate it.  Endocrinologist suggest it may have been due to her afib.  Trying treatment again to see if she is able to tolerate it.  As of now she only reports nausea about 2 hours after taking medication. She treats that with peppermint tea and that helps with the symptoms.  We also discussed HRT.  She would like to take HRT, she says that her mother and sister also took HRT without difficulty.  No family history of breast cancer,  she is up to date on mammograms.  Discussed other risks, she reports that she is willing to try and see how she responds.  She does not have a uterus. Start estradiol 0.5 mg daily.   Weight gain: patient reports she is concerned that she has gained about 7 lbs.  She reports her eating has not changed. Will check thyroid level.       01/11/2024    8:57 AM 07/30/2023   10:42 AM  07/21/2023   10:56 AM  Depression screen PHQ 2/9  Decreased Interest 0 0 0  Down, Depressed, Hopeless 0 0 0  PHQ - 2 Score 0 0 0  Altered sleeping 0  1  Tired, decreased energy 0  1  Change in appetite 0  0  Feeling bad or failure about yourself  0  0  Trouble concentrating 0  0  Moving slowly or fidgety/restless 0  0  Suicidal thoughts 0  0  PHQ-9 Score 0  2  Difficult doing work/chores Not difficult at all  Not difficult at all    Relevant past medical, surgical, family and social history reviewed and updated as indicated. Interim medical history since our last visit reviewed. Allergies and medications reviewed and updated.  Constitutional: Negative for fever, report mild weight gain.  Respiratory: Negative for cough and shortness of breath.   Cardiovascular: Negative for chest pain or palpitations.  Gastrointestinal: Negative for abdominal pain, no bowel changes.  Musculoskeletal: Negative for gait problem or joint swelling.  Skin: Negative for rash.  Neurological: Negative for dizziness or headache.  No other specific complaints in a complete review of systems (except as listed in HPI above).        Objective:    BP 116/74   Pulse  67   Temp 97.8 F (36.6 C)   Resp 18   Ht 5\' 4"  (1.626 m)   Wt 157 lb 12.8 oz (71.6 kg)   LMP  (LMP Unknown)   SpO2 96%   BMI 27.09 kg/m   BP Readings from Last 3 Encounters:  01/18/24 116/74  01/07/24 130/80  11/20/23 138/70     Wt Readings from Last 3 Encounters:  01/18/24 157 lb 12.8 oz (71.6 kg)  01/11/24 155 lb (70.3 kg)  01/07/24 158 lb 12.8 oz (72 kg)    Physical Exam Vitals reviewed.  Constitutional:      Appearance: Normal appearance.  HENT:     Head: Normocephalic.  Cardiovascular:     Rate and Rhythm: Normal rate and regular rhythm.  Pulmonary:     Effort: Pulmonary effort is normal.     Breath sounds: Normal breath sounds.  Musculoskeletal:        General: Normal range of motion.  Skin:    General: Skin is  warm and dry.  Neurological:     General: No focal deficit present.     Mental Status: She is alert and oriented to person, place, and time. Mental status is at baseline.  Psychiatric:        Mood and Affect: Mood normal.        Behavior: Behavior normal.        Thought Content: Thought content normal.        Judgment: Judgment normal.     Results for orders placed or performed in visit on 07/21/23  Cervicovaginal ancillary only   Collection Time: 07/21/23 11:11 AM  Result Value Ref Range   Neisseria Gonorrhea Negative    Chlamydia Negative    Trichomonas Negative    Bacterial Vaginitis (gardnerella) Negative    Candida Vaginitis Negative    Candida Glabrata Negative    Comment      Normal Reference Range Bacterial Vaginosis - Negative   Comment Normal Reference Range Candida Species - Negative    Comment Normal Reference Range Candida Galbrata - Negative    Comment Normal Reference Range Trichomonas - Negative    Comment Normal Reference Ranger Chlamydia - Negative    Comment      Normal Reference Range Neisseria Gonorrhea - Negative   Last CBC Lab Results  Component Value Date   WBC 6.0 02/24/2023   HGB 14.2 02/24/2023   HCT 41.9 02/24/2023   MCV 93.3 02/24/2023   MCH 31.6 02/24/2023   RDW 11.9 02/24/2023   PLT 186 02/24/2023   Last metabolic panel Lab Results  Component Value Date   GLUCOSE 94 07/15/2023   NA 140 07/15/2023   K 4.8 07/15/2023   CL 105 07/15/2023   CO2 21 07/15/2023   BUN 15 07/15/2023   CREATININE 0.71 07/15/2023   EGFR 91 07/15/2023   CALCIUM 9.6 07/15/2023   PROT 7.0 10/06/2022   BILITOT 0.8 10/06/2022   AST 17 10/06/2022   ALT 16 10/06/2022   ANIONGAP 3 (L) 02/24/2023   Last lipids Lab Results  Component Value Date   CHOL 188 10/06/2022   HDL 82 10/06/2022   LDLCALC 90 10/06/2022   TRIG 71 10/06/2022   CHOLHDL 2.3 10/06/2022        Assessment & Plan:   Problem List Items Addressed This Visit       Cardiovascular and  Mediastinum   Persistent atrial fibrillation (HCC) - Primary   Pt condition is stable, will continue current medications  and return for 6 month follow up appointment.        Musculoskeletal and Integument   Arthritis   Pt condition is stable and will return for 6 month follow up appointment.      Age-related osteoporosis without current pathological fracture   Pt condition is stable, will continue current medications and exercise regimen and will return for 6 month follow up appointment. We also discussed HRT.  She would like to take HRT, she says that her mother and sister also took HRT without difficulty.  No family history of breast cancer,  she is up to date on mammograms.  Discussed other risks, she reports that she is willing to try and see how she responds.  She does not have a uterus. Start estradiol 0.5 mg daily.       Relevant Medications   estradiol (ESTRACE) 0.5 MG tablet     Other   Menopause   We also discussed HRT.  She would like to take HRT, she says that her mother and sister also took HRT without difficulty.  No family history of breast cancer,  she is up to date on mammograms.  Discussed other risks, she reports that she is willing to try and see how she responds.  She does not have a uterus. Start estradiol 0.5 mg daily.       Relevant Medications   estradiol (ESTRACE) 0.5 MG tablet   Other Visit Diagnoses       Screening for diabetes mellitus       Relevant Orders   COMPLETE METABOLIC PANEL WITH GFR   Hemoglobin A1c     Screening for cholesterol level       Relevant Orders   Lipid panel     Screening for deficiency anemia       Relevant Orders   CBC with Differential/Platelet     Weight gain       will check TSH   Relevant Orders   TSH         Follow up plan: Return in about 6 months (around 07/17/2024) for follow up.

## 2024-01-18 NOTE — Assessment & Plan Note (Addendum)
Pt condition is stable, will continue current medications and return for 6 month follow up appointment.

## 2024-01-18 NOTE — Assessment & Plan Note (Signed)
Pt condition is stable and will return for 6 month follow up appointment.

## 2024-01-19 ENCOUNTER — Encounter: Payer: Self-pay | Admitting: Nurse Practitioner

## 2024-01-19 LAB — COMPLETE METABOLIC PANEL WITH GFR
AG Ratio: 1.6 (calc) (ref 1.0–2.5)
ALT: 19 U/L (ref 6–29)
AST: 20 U/L (ref 10–35)
Albumin: 4.2 g/dL (ref 3.6–5.1)
Alkaline phosphatase (APISO): 61 U/L (ref 37–153)
BUN: 16 mg/dL (ref 7–25)
CO2: 29 mmol/L (ref 20–32)
Calcium: 10.1 mg/dL (ref 8.6–10.4)
Chloride: 107 mmol/L (ref 98–110)
Creat: 0.72 mg/dL (ref 0.60–1.00)
Globulin: 2.6 g/dL (ref 1.9–3.7)
Glucose, Bld: 90 mg/dL (ref 65–99)
Potassium: 4.9 mmol/L (ref 3.5–5.3)
Sodium: 141 mmol/L (ref 135–146)
Total Bilirubin: 0.7 mg/dL (ref 0.2–1.2)
Total Protein: 6.8 g/dL (ref 6.1–8.1)
eGFR: 89 mL/min/{1.73_m2} (ref >=60)

## 2024-01-19 LAB — CBC WITH DIFFERENTIAL/PLATELET
Absolute Lymphocytes: 1897 {cells}/uL (ref 850–3900)
Absolute Monocytes: 291 {cells}/uL (ref 200–950)
Basophils Absolute: 41 {cells}/uL (ref 0–200)
Basophils Relative: 0.8 %
Eosinophils Absolute: 133 {cells}/uL (ref 15–500)
Eosinophils Relative: 2.6 %
HCT: 43.7 % (ref 35.0–45.0)
Hemoglobin: 14.6 g/dL (ref 11.7–15.5)
MCH: 31.4 pg (ref 27.0–33.0)
MCHC: 33.4 g/dL (ref 32.0–36.0)
MCV: 94 fL (ref 80.0–100.0)
MPV: 13.8 fL — ABNORMAL HIGH (ref 7.5–12.5)
Monocytes Relative: 5.7 %
Neutro Abs: 2739 {cells}/uL (ref 1500–7800)
Neutrophils Relative %: 53.7 %
Platelets: 189 10*3/uL (ref 140–400)
RBC: 4.65 10*6/uL (ref 3.80–5.10)
RDW: 11.7 % (ref 11.0–15.0)
Total Lymphocyte: 37.2 %
WBC: 5.1 10*3/uL (ref 3.8–10.8)

## 2024-01-19 LAB — LIPID PANEL
Cholesterol: 178 mg/dL (ref <200)
HDL: 81 mg/dL (ref >=50)
LDL Cholesterol (Calc): 82 mg/dL
Non-HDL Cholesterol (Calc): 97 mg/dL (ref <130)
Total CHOL/HDL Ratio: 2.2 (calc) (ref <5.0)
Triglycerides: 69 mg/dL (ref <150)

## 2024-01-19 LAB — HEMOGLOBIN A1C
Hgb A1c MFr Bld: 5.7 %{Hb} — ABNORMAL HIGH (ref <5.7)
Mean Plasma Glucose: 117 mg/dL
eAG (mmol/L): 6.5 mmol/L

## 2024-01-19 LAB — TSH: TSH: 2.01 m[IU]/L (ref 0.40–4.50)

## 2024-01-21 ENCOUNTER — Ambulatory Visit: Payer: Medicare Other | Admitting: Nurse Practitioner

## 2024-02-11 ENCOUNTER — Other Ambulatory Visit: Payer: Self-pay | Admitting: Cardiology

## 2024-02-29 ENCOUNTER — Ambulatory Visit
Admission: RE | Admit: 2024-02-29 | Discharge: 2024-02-29 | Disposition: A | Discharge status: Home / Self Care | Payer: 59 | Source: Ambulatory Visit | Attending: Nurse Practitioner | Admitting: Nurse Practitioner

## 2024-02-29 DIAGNOSIS — Z0000 Encounter for general adult medical examination without abnormal findings: Secondary | ICD-10-CM

## 2024-02-29 DIAGNOSIS — Z1231 Encounter for screening mammogram for malignant neoplasm of breast: Secondary | ICD-10-CM | POA: Insufficient documentation

## 2024-04-08 ENCOUNTER — Other Ambulatory Visit: Payer: Self-pay | Admitting: Nurse Practitioner

## 2024-04-08 DIAGNOSIS — I4891 Unspecified atrial fibrillation: Secondary | ICD-10-CM

## 2024-04-08 NOTE — Telephone Encounter (Signed)
 Requested Prescriptions  Pending Prescriptions Disp Refills   rivaroxaban  (XARELTO ) 20 MG TABS tablet [Pharmacy Med Name: XARELTO  20 MG TABLET] 90 tablet 1    Sig: TAKE 1 TABLET BY MOUTH DAILY WITH SUPPER     Hematology: Anticoagulants - rivaroxaban  Passed - 04/08/2024  5:19 PM      Passed - ALT in normal range and within 360 days    ALT  Date Value Ref Range Status  01/18/2024 19 6 - 29 U/L Final         Passed - AST in normal range and within 360 days    AST  Date Value Ref Range Status  01/18/2024 20 10 - 35 U/L Final         Passed - Cr in normal range and within 360 days    Creat  Date Value Ref Range Status  01/18/2024 0.72 0.60 - 1.00 mg/dL Final         Passed - HCT in normal range and within 360 days    HCT  Date Value Ref Range Status  01/18/2024 43.7 35.0 - 45.0 % Final         Passed - HGB in normal range and within 360 days    Hemoglobin  Date Value Ref Range Status  01/18/2024 14.6 11.7 - 15.5 g/dL Final         Passed - PLT in normal range and within 360 days    Platelets  Date Value Ref Range Status  01/18/2024 189 140 - 400 Thousand/uL Final         Passed - eGFR is 15 or above and within 360 days    GFR, Estimated  Date Value Ref Range Status  02/24/2023 >60 >60 mL/min Final    Comment:    (NOTE) Calculated using the CKD-EPI Creatinine Equation (2021)    eGFR  Date Value Ref Range Status  01/18/2024 89 > OR = 60 mL/min/1.71m2 Final  07/15/2023 91 >59 mL/min/1.73 Final         Passed - Patient is not pregnant      Passed - Valid encounter within last 12 months    Recent Outpatient Visits           2 months ago Persistent atrial fibrillation [facility])   [facility] Quinton Buckler, FNP       Future Appointments             In 3 months Abram Hoguet, Monalisa Angles, FNP [facility], [facility]

## 2024-05-24 NOTE — Progress Notes (Unsigned)
  Electrophysiology Office Follow up Visit Note:    Date:  05/25/2024   ID:  Kathy Holt, DOB 1951-12-08, MRN [id number]  PCP:  Quinton Buckler, FNP  [facility] Cardiologist:  None  [facility] Electrophysiologist:  Boyce Byes, MD    Interval History:     Kathy Holt is a 73 y.o. female who presents for a follow up visit.   She was last seen by Ottie Blonder November 20, 2023.  She has a history of persistent atrial fibrillation on Tikosyn , hypertension.  She had a prior catheter ablation in March 2024.  She is on Xarelto  for stroke prophylaxis.  She is doing well today.  She is got back from a trip to [place].  That was a celebratory trip after her retirement.  They bought a house in [place]  while they were in [place] and are moving July 8.  She is doing well with her Tikosyn .  No known recurrence of arrhythmia.       Past medical, surgical, social and family history were reviewed.  ROS:   Please see the history of present illness.    All other systems reviewed and are negative.  EKGs/Labs/Other Studies Reviewed:    The following studies were reviewed today:     EKG Interpretation Date/Time:  Wednesday May 25 2024 09:06:37 EDT Ventricular Rate:  52 PR Interval:  220 QRS Duration:  80 QT Interval:  462 QTC Calculation: 429 R Axis:   47  Text Interpretation: Sinus bradycardia with 1st degree A-V block Confirmed by Harvie Liner [phone]) on 05/25/2024 9:18:31 AM    Physical Exam:    VS:  BP (!) 140/80 (BP Location: Right Arm)   Pulse (!) 52   Ht 5' 4 (1.626 m)   Wt 152 lb (68.9 kg)   LMP  (LMP Unknown)   SpO2 95%   BMI 26.09 kg/m     Wt Readings from Last 3 Encounters:  05/25/24 152 lb (68.9 kg)  01/18/24 157 lb 12.8 oz (71.6 kg)  01/11/24 155 lb (70.3 kg)     GEN: no distress CARD: RRR, No MRG RESP: No IWOB. CTAB.      ASSESSMENT:    1. Persistent atrial fibrillation (HCC)   2. Encounter for monitoring  dofetilide  therapy   3. Primary hypertension    PLAN:    In order of problems listed above:  #Persistent atrial fibrillation #High risk drug monitoring-Tikosyn  Doing well on Tikosyn  QTc on today's EKG acceptable for ongoing use Continue Xarelto  for stroke prophylaxis Check BMP and mag today.  Follow-up APP in 4 months if still in the area.  I did encourage her to establish with a primary care physician and electrophysiologist in [place] .  I recommended some of the docs in the [facility] system.    Signed, Harvie Liner, MD, [facility], [facility] 05/25/2024 9:18 AM    Electrophysiology [facility]

## 2024-05-25 ENCOUNTER — Other Ambulatory Visit: Payer: Self-pay

## 2024-05-25 ENCOUNTER — Ambulatory Visit: Attending: Cardiology | Admitting: Cardiology

## 2024-05-25 VITALS — BP 140/80 | HR 52 | Ht 64.0 in | Wt 152.0 lb

## 2024-05-25 DIAGNOSIS — I10 Essential (primary) hypertension: Secondary | ICD-10-CM

## 2024-05-25 DIAGNOSIS — I4819 Other persistent atrial fibrillation: Secondary | ICD-10-CM | POA: Diagnosis not present

## 2024-05-25 DIAGNOSIS — Z79899 Other long term (current) drug therapy: Secondary | ICD-10-CM

## 2024-05-25 DIAGNOSIS — Z5181 Encounter for therapeutic drug level monitoring: Secondary | ICD-10-CM

## 2024-05-25 NOTE — Patient Instructions (Signed)
 Medication Instructions:  Your physician recommends that you continue on your current medications as directed. Please refer to the Current Medication list given to you today.  *If you need a refill on your cardiac medications before your next appointment, please call your pharmacy*  Labs BMET and Mag  Follow-Up: At [facility], you and your health needs are our priority.  As part of our continuing mission to provide you with exceptional heart care, our providers are all part of one team.  This team includes your primary Cardiologist (physician) and Advanced Practice Providers or APPs (Physician Assistants and Nurse Practitioners) who all work together to provide you with the care you need, when you need it.  Your next appointment:   4 months   Provider:   You may see Boyce Byes, MD or one of the following Advanced Practice Providers on your designated Care Team:   Mertha Abrahams, [place] Merla Starch, PA-C Suzann Riddle, NP Creighton Doffing, NP

## 2024-05-26 LAB — BASIC METABOLIC PANEL WITH GFR
BUN/Creatinine Ratio: 23 (ref 12–28)
BUN: 15 mg/dL (ref 8–27)
CO2: 23 mmol/L (ref 20–29)
Calcium: 8.9 mg/dL (ref 8.7–10.3)
Chloride: 105 mmol/L (ref 96–106)
Creatinine, Ser: 0.64 mg/dL (ref 0.57–1.00)
Glucose: 91 mg/dL (ref 70–99)
Potassium: 4.5 mmol/L (ref 3.5–5.2)
Sodium: 140 mmol/L (ref 134–144)
eGFR: 94 mL/min/{1.73_m2} (ref >59)

## 2024-05-26 LAB — MAGNESIUM: Magnesium: 2.3 mg/dL (ref 1.6–2.3)

## 2024-06-01 ENCOUNTER — Ambulatory Visit: Admitting: Cardiology

## 2024-06-07 ENCOUNTER — Encounter: Payer: Self-pay | Admitting: Nurse Practitioner

## 2024-06-07 ENCOUNTER — Ambulatory Visit (INDEPENDENT_AMBULATORY_CARE_PROVIDER_SITE_OTHER): Admitting: Nurse Practitioner

## 2024-06-07 DIAGNOSIS — I4891 Unspecified atrial fibrillation: Secondary | ICD-10-CM

## 2024-06-07 DIAGNOSIS — M810 Age-related osteoporosis without current pathological fracture: Secondary | ICD-10-CM

## 2024-06-07 DIAGNOSIS — Z780 Asymptomatic menopausal state: Secondary | ICD-10-CM | POA: Diagnosis not present

## 2024-06-07 MED ORDER — RIVAROXABAN 20 MG PO TABS: 20.0000 mg | ORAL_TABLET | Freq: Every day | ORAL | 3 refills | Status: AC

## 2024-06-07 MED ORDER — ESTRADIOL 0.5 MG PO TABS: 0.5000 mg | ORAL_TABLET | Freq: Every day | ORAL | 3 refills | Status: AC

## 2024-06-07 NOTE — Progress Notes (Signed)
 BP 136/80   Pulse 93   Temp 97.7 F (36.5 C)   Resp 18   Ht 5' 4 (1.626 m)   Wt 152 lb 12.8 oz (69.3 kg)   LMP  (LMP Unknown)   SpO2 95%   BMI 26.23 kg/m    Subjective:    Patient ID: Kathy Holt, female    DOB: 1951-02-22, 73 y.o.   MRN: [id number]  HPI: Kathy Holt is a 73 y.o. female  Chief Complaint  Patient presents with   Medical Management of Chronic Issues    Discussed the use of AI scribe software for clinical note transcription with the patient, who gave verbal consent to proceed.  History of Present Illness Kathy Holt is a 73 year old female who presents for medication management and follow-up.  She has a history of atrial fibrillation and is under the care of a cardiologist. She takes Xarelto  20 mg daily and dofetilide  500 mcg two times a day. Her last A1c was 5.7, indicating a prediabetic range, and her cholesterol levels were normal. Her blood pressure was noted to be slightly elevated by her nurse, but it normalized upon rechecking.  She has arthritis and osteoporosis. During a recent trip to [place], she experienced severe contact dermatitis between her legs, described as 'on fire.' She self-treated with a course of steroids prescribed for her knee pain, which resolved the dermatitis and alleviated her knee symptoms.  She is taking estradiol  0.5 mg daily for menopause-related symptoms. She reports ongoing issues with vaginal chemistry, noting that oxygen therapy has been helping, although it is not yet fully effective. She describes the improvement as a 'slow process, but consistent.'  She is in the process of moving and is concerned about finding new healthcare providers. She has arranged for a three-month supply of her medications to ensure continuity during the transition and is coordinating with her current pharmacy to facilitate prescription transfers.         06/07/2024    8:56 AM 01/11/2024    8:57 AM 07/30/2023   10:42 AM   Depression screen PHQ 2/9  Decreased Interest 0 0 0  Down, Depressed, Hopeless 0 0 0  PHQ - 2 Score 0 0 0  Altered sleeping 0 0   Tired, decreased energy 0 0   Change in appetite 0 0   Feeling bad or failure about yourself  0 0   Trouble concentrating 0 0   Moving slowly or fidgety/restless 0 0   Suicidal thoughts 0 0   PHQ-9 Score 0 0   Difficult doing work/chores Not difficult at all Not difficult at all     Relevant past medical, surgical, family and social history reviewed and updated as indicated. Interim medical history since our last visit reviewed. Allergies and medications reviewed and updated.  Review of Systems  Constitutional: Negative for fever or weight change.  Respiratory: Negative for cough and shortness of breath.   Cardiovascular: Negative for chest pain or palpitations.  Gastrointestinal: Negative for abdominal pain, no bowel changes.  Musculoskeletal: Negative for gait problem or joint swelling.  Skin: Negative for rash.  Neurological: Negative for dizziness or headache.  No other specific complaints in a complete review of systems (except as listed in HPI above).      Objective:     BP 136/80   Pulse 93   Temp 97.7 F (36.5 C)   Resp 18   Ht 5' 4 (1.626 m)   Wt 152  lb 12.8 oz (69.3 kg)   LMP  (LMP Unknown)   SpO2 95%   BMI 26.23 kg/m    Wt Readings from Last 3 Encounters:  06/07/24 152 lb 12.8 oz (69.3 kg)  05/25/24 152 lb (68.9 kg)  01/18/24 157 lb 12.8 oz (71.6 kg)    Physical Exam Physical Exam GENERAL: Alert, cooperative, well developed, no acute distress HEENT: Normocephalic, normal oropharynx, moist mucous membranes CHEST: Clear to auscultation bilaterally, No wheezes, rhonchi, or crackles CARDIOVASCULAR: Normal heart rate and rhythm, S1 and S2 normal without murmurs ABDOMEN: Soft, non-tender, non-distended, without organomegaly, Normal bowel sounds EXTREMITIES: No cyanosis or edema NEUROLOGICAL: Cranial nerves grossly intact,  Moves all extremities without gross motor or sensory deficit   Results for orders placed or performed in visit on 05/25/24  Magnesium    Collection Time: 05/25/24  9:37 AM  Result Value Ref Range   Magnesium  2.3 1.6 - 2.3 mg/dL  Basic metabolic panel with GFR   Collection Time: 05/25/24  9:37 AM  Result Value Ref Range   Glucose 91 70 - 99 mg/dL   BUN 15 8 - 27 mg/dL   Creatinine, Ser 9.35 0.57 - 1.00 mg/dL   eGFR 94 >40 fO/fpw/8.26   BUN/Creatinine Ratio 23 12 - 28   Sodium 140 134 - 144 mmol/L   Potassium 4.5 3.5 - 5.2 mmol/L   Chloride 105 96 - 106 mmol/L   CO2 23 20 - 29 mmol/L   Calcium 8.9 8.7 - 10.3 mg/dL          Assessment & Plan:   Problem List Items Addressed This Visit       Musculoskeletal and Integument   Age-related osteoporosis without current pathological fracture   Relevant Medications   estradiol  (ESTRACE ) 0.5 MG tablet     Other   Menopause   Relevant Medications   estradiol  (ESTRACE ) 0.5 MG tablet   Other Visit Diagnoses       Atrial fibrillation, unspecified type (HCC)       Relevant Medications   rivaroxaban  (XARELTO ) 20 MG TABS tablet        Assessment and Plan Assessment & Plan Contact dermatitis Recent severe and burning contact dermatitis between the legs while in [place], resolved with a course of steroids initially prescribed for knee pain.  Atrial fibrillation Managed by cardiology. Recent cardiology visit.  Hypertension Blood pressure slightly elevated during visit, but normal readings at recent cardiology recheck.  Osteoporosis/menopause -continue estrogen 0.5 mg daily -weight bearing exercises -vitamin d  and calcium enriched diet  General Health Maintenance She is managing medication refills during her move to [place]  and finding new healthcare providers. - Ensure medication refills for three months. - Coordinate prescription transfer to [place]  pharmacy.        Follow up plan: Return if symptoms worsen or  fail to improve.

## 2024-07-18 ENCOUNTER — Ambulatory Visit: Payer: 59 | Admitting: Nurse Practitioner

## 2024-10-25 ENCOUNTER — Other Ambulatory Visit: Payer: Self-pay | Admitting: Cardiology

## 2025-01-10 ENCOUNTER — Ambulatory Visit: Payer: 59 | Admitting: Endocrinology

## 2025-01-19 ENCOUNTER — Ambulatory Visit
